# Patient Record
Sex: Male | Born: 2000
Health system: Southern US, Community
[De-identification: ages and names within clinical notes are randomized; demographics above are authoritative.]

## PROBLEM LIST (undated history)

## (undated) DIAGNOSIS — IMO0001 Reserved for inherently not codable concepts without codable children: Secondary | ICD-10-CM

## (undated) DIAGNOSIS — K219 Gastro-esophageal reflux disease without esophagitis: Secondary | ICD-10-CM

---
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
## 2020-06-14 DIAGNOSIS — K2900 Acute gastritis without bleeding: Secondary | ICD-10-CM | POA: Diagnosis not present

## 2020-06-14 DIAGNOSIS — Z0000 Encounter for general adult medical examination without abnormal findings: Secondary | ICD-10-CM | POA: Diagnosis not present

## 2020-06-14 DIAGNOSIS — K2100 Gastro-esophageal reflux disease with esophagitis, without bleeding: Secondary | ICD-10-CM | POA: Diagnosis not present

## 2020-06-14 DIAGNOSIS — R1013 Epigastric pain: Secondary | ICD-10-CM | POA: Diagnosis not present

## 2020-06-17 DIAGNOSIS — K2900 Acute gastritis without bleeding: Secondary | ICD-10-CM | POA: Diagnosis not present

## 2020-06-17 DIAGNOSIS — R1013 Epigastric pain: Secondary | ICD-10-CM | POA: Diagnosis not present

## 2020-06-17 DIAGNOSIS — Z0000 Encounter for general adult medical examination without abnormal findings: Secondary | ICD-10-CM | POA: Diagnosis not present

## 2020-06-17 DIAGNOSIS — K2100 Gastro-esophageal reflux disease with esophagitis, without bleeding: Secondary | ICD-10-CM | POA: Diagnosis not present

## 2020-10-18 ENCOUNTER — Ambulatory Visit (HOSPITAL_COMMUNITY)
Admission: EM | Admit: 2020-10-18 | Discharge: 2020-10-18 | Disposition: A | Discharge status: Home / Self Care | Payer: 59 | Attending: Emergency Medicine | Admitting: Emergency Medicine

## 2020-10-18 ENCOUNTER — Ambulatory Visit (INDEPENDENT_AMBULATORY_CARE_PROVIDER_SITE_OTHER): Payer: 59

## 2020-10-18 ENCOUNTER — Encounter (HOSPITAL_COMMUNITY): Payer: Self-pay | Admitting: Emergency Medicine

## 2020-10-18 ENCOUNTER — Other Ambulatory Visit: Payer: Self-pay

## 2020-10-18 DIAGNOSIS — S63502A Unspecified sprain of left wrist, initial encounter: Secondary | ICD-10-CM

## 2020-10-18 DIAGNOSIS — M79645 Pain in left finger(s): Secondary | ICD-10-CM | POA: Diagnosis not present

## 2020-10-18 DIAGNOSIS — M25532 Pain in left wrist: Secondary | ICD-10-CM

## 2020-10-18 DIAGNOSIS — S6992XA Unspecified injury of left wrist, hand and finger(s), initial encounter: Secondary | ICD-10-CM | POA: Diagnosis not present

## 2020-10-18 NOTE — ED Provider Notes (Signed)
[facility]    CSN: [id number] Arrival date & time: 10/18/20  1051      History   Chief Complaint Chief Complaint  Patient presents with   Hand Injury    Left     HPI Roger Franklin is a 20 y.o. male.   Patient here for evaluation of left hand and wrist pain that started earlier today.  Reports using a drill when it fell and "wrenched" left hand and wrist.  Reports aching pain that is worse with movement.  Reports decreased range of motion in left wrist and hand due to pain.  Patient is able to wiggle fingers and does have sensation in fingertips.  Denies any fevers, chest pain, shortness of breath, N/V/D, numbness, tingling, weakness, abdominal pain, or headaches.    The history is provided by the patient.  Hand Injury  Past Medical History:  Diagnosis Date   Reflux     There are no problems to display for this patient.   History reviewed. No pertinent surgical history.     Home Medications    Prior to Admission medications   Medication Sig Start Date End Date Taking? Authorizing Provider  amoxicillin (AMOXIL) 500 MG capsule Take 1 capsule (500 mg total) by mouth 3 (three) times daily. 04/18/18   Wurst, [place], PA-C  lidocaine (XYLOCAINE) 2 % solution Use as directed 15 mLs in the mouth or throat as needed for mouth pain. 04/18/18   Wurst, [place], PA-C  omeprazole (PRILOSEC) 20 MG capsule Take 20 mg by mouth daily.    [provider]    Family History Family History  Problem Relation Age of Onset   Healthy Mother    Healthy Father     Social History Social History   Tobacco Use   Smoking status: Never   Smokeless tobacco: Never  Vaping Use   Vaping Use: Never used  Substance Use Topics   Alcohol use: No   Drug use: No     Allergies   Patient has no known allergies.   Review of Systems Review of Systems  Musculoskeletal:  Positive for arthralgias and joint swelling.  All other systems reviewed and are  negative.   Physical Exam Triage Vital Signs ED Triage Vitals  Enc Vitals Group     BP 10/18/20 1149 113/75     Pulse Rate 10/18/20 1149 83     Resp --      Temp 10/18/20 1149 98 F (36.7 C)     Temp Source 10/18/20 1149 Oral     SpO2 10/18/20 1149 98 %     Weight --      Height --      Head Circumference --      Peak Flow --      Pain Score 10/18/20 1146 8     Pain Loc --      Pain Edu? --      Excl. in GC? --    No data found.  Updated Vital Signs BP 113/75 (BP Location: Right Arm)   Pulse 83   Temp 98 F (36.7 C) (Oral)   SpO2 98%   Visual Acuity Right Eye Distance:   Left Eye Distance:   Bilateral Distance:    Right Eye Near:   Left Eye Near:    Bilateral Near:     Physical Exam Vitals and nursing note reviewed.  Constitutional:      General: He is not in acute distress.    Appearance:  Normal appearance. He is not ill-appearing, toxic-appearing or diaphoretic.  HENT:     Head: Normocephalic and atraumatic.  Eyes:     Conjunctiva/sclera: Conjunctivae normal.  Cardiovascular:     Rate and Rhythm: Normal rate.     Pulses: Normal pulses.  Pulmonary:     Effort: Pulmonary effort is normal.  Abdominal:     General: Abdomen is flat.  Musculoskeletal:     Left wrist: Swelling, tenderness and bony tenderness present. Decreased range of motion. Normal pulse.     Left hand: Swelling, tenderness (left thumb) and bony tenderness present. Normal pulse.     Cervical back: Normal range of motion.  Skin:    General: Skin is warm and dry.  Neurological:     General: No focal deficit present.     Mental Status: He is alert and oriented to person, place, and time.  Psychiatric:        Mood and Affect: Mood normal.     UC Treatments / Results  Labs (all labs ordered are listed, but only abnormal results are displayed) Labs Reviewed - No data to display  EKG   Radiology DG Wrist Complete Left  Result Date: 10/18/2020 CLINICAL DATA:  Pain with history of  injury EXAM: LEFT WRIST - COMPLETE 3+ VIEW; LEFT THUMB 2+V COMPARISON:  None. FINDINGS: Left wrist and thumb: There is no evidence of fracture or dislocation. There is no evidence of arthropathy or other focal bone abnormality. Soft tissues are unremarkable. IMPRESSION: No acute osseous abnormality. Electronically Signed   By: Allegra Lai M.D.   On: 10/18/2020 12:54   DG Finger Thumb Left  Result Date: 10/18/2020 CLINICAL DATA:  Pain with history of injury EXAM: LEFT WRIST - COMPLETE 3+ VIEW; LEFT THUMB 2+V COMPARISON:  None. FINDINGS: Left wrist and thumb: There is no evidence of fracture or dislocation. There is no evidence of arthropathy or other focal bone abnormality. Soft tissues are unremarkable. IMPRESSION: No acute osseous abnormality. Electronically Signed   By: Allegra Lai M.D.   On: 10/18/2020 12:54    Procedures Procedures (including critical care time)  Medications Ordered in UC Medications - No data to display  Initial Impression / Assessment and Plan / UC Course  I have reviewed the triage vital signs and the nursing notes.  Pertinent labs & imaging results that were available during my care of the patient were reviewed by me and considered in my medical decision making (see chart for details).    Assessment negative for red flags or concerns.  X-ray with no acute bony abnormalities.  Likely a left wrist sprain.  Wrist brace applied in office and may wear as needed for comfort.  Tylenol and/or ibuprofen as needed.  Recommend rest, ice, compression, and elevation.  Follow-up with orthopedics or sports medicine if symptoms do not improve in the next week Final Clinical Impressions(s) / UC Diagnoses   Final diagnoses:  Wrist sprain, left, initial encounter     Discharge Instructions      You can take Ibuprofen and/or Tylenol as needed for pain relief and fever reduction.   Wear the splint as needed for comfort.   Rest as much as possible Ice for 10-15 minutes  every 4-6 hours as needed for pain and swelling Compression- use an ace bandage or splint for comfort Elevate above your hip/heart when sitting and laying down  Follow up with sports medicine or orthopedics if symptoms do not improve in the next few days.  ED Prescriptions   None    PDMP not reviewed this encounter.   Ivette Loyal, NP 10/18/20 1321

## 2020-10-18 NOTE — ED Triage Notes (Signed)
Pt injured left hand using a drill at work today.

## 2020-10-18 NOTE — Discharge Instructions (Signed)
You can take Ibuprofen and/or Tylenol as needed for pain relief and fever reduction.   Wear the splint as needed for comfort.   Rest as much as possible Ice for 10-15 minutes every 4-6 hours as needed for pain and swelling Compression- use an ace bandage or splint for comfort Elevate above your hip/heart when sitting and laying down  Follow up with sports medicine or orthopedics if symptoms do not improve in the next few days.

## 2021-02-08 ENCOUNTER — Other Ambulatory Visit: Payer: Self-pay

## 2021-02-08 ENCOUNTER — Emergency Department (HOSPITAL_COMMUNITY)
Admission: EM | Admit: 2021-02-08 | Discharge: 2021-02-08 | Disposition: A | Discharge status: Home / Self Care | Payer: No Typology Code available for payment source | Attending: Emergency Medicine | Admitting: Emergency Medicine

## 2021-02-08 ENCOUNTER — Encounter (HOSPITAL_COMMUNITY): Payer: Self-pay | Admitting: Emergency Medicine

## 2021-02-08 DIAGNOSIS — W231XXA Caught, crushed, jammed, or pinched between stationary objects, initial encounter: Secondary | ICD-10-CM | POA: Diagnosis not present

## 2021-02-08 DIAGNOSIS — S0990XA Unspecified injury of head, initial encounter: Secondary | ICD-10-CM | POA: Diagnosis present

## 2021-02-08 DIAGNOSIS — Y990 Civilian activity done for income or pay: Secondary | ICD-10-CM | POA: Diagnosis not present

## 2021-02-08 DIAGNOSIS — S0181XA Laceration without foreign body of other part of head, initial encounter: Secondary | ICD-10-CM | POA: Diagnosis not present

## 2021-02-08 NOTE — ED Triage Notes (Signed)
Pt was using a power tool when jumped back and hit the right side of his head while on a latter. Reports pain 6 out of 10. Pt reports ongoing feeling lightheaded since event. Pt has laceration to right upper eyebrow. No LOC. NO fall off of latter.

## 2021-02-08 NOTE — ED Provider Notes (Signed)
[facility] EMERGENCY DEPARTMENT Provider Note   CSN: [id number] Arrival date & time:        History  Chief Complaint  Patient presents with   Head Laceration    Roger Franklin is a 21 y.o. male presenting to the ED with a laceration to the forehead.  The patient ports he was at work today using of power tools or drill, and lost control the drill and jumped up and struck him in the right forehead.  He has a 1 similar laceration above his right eyebrow.  He denies loss of consciousness.  He denies any significant headache.  This incident occurred about 5 hours ago, and he feels otherwise normal.  He reports his tetanus is up-to-date through work.  HPI     Home Medications Prior to Admission medications   Medication Sig Start Date End Date Taking? Authorizing Provider  amoxicillin (AMOXIL) 500 MG capsule Take 1 capsule (500 mg total) by mouth 3 (three) times daily. 04/18/18   Wurst, [place], PA-C  lidocaine (XYLOCAINE) 2 % solution Use as directed 15 mLs in the mouth or throat as needed for mouth pain. 04/18/18   Wurst, [place], PA-C  omeprazole (PRILOSEC) 20 MG capsule Take 20 mg by mouth daily.    [provider]      Allergies    Patient has no known allergies.    Review of Systems   Review of Systems  Physical Exam Updated Vital Signs BP 115/73 (BP Location: Right Arm)    Pulse 82    Temp 97.8 F (36.6 C) (Oral)    Resp 13    SpO2 98%  Physical Exam Constitutional:      General: He is not in acute distress. HENT:     Head: Normocephalic.      Comments: 1 cm horizontal laceration above the right eyebrow, along forehead crease Eyes:     Conjunctiva/sclera: Conjunctivae normal.     Pupils: Pupils are equal, round, and reactive to light.  Skin:    General: Skin is warm and dry.  Neurological:     Mental Status: He is alert.  Psychiatric:        Mood and Affect: Mood normal.        Behavior: Behavior normal.    ED Results / Procedures  / Treatments   Labs (all labs ordered are listed, but only abnormal results are displayed) Labs Reviewed - No data to display  EKG None  Radiology No results found.  Procedures .Marland KitchenLaceration Repair  Date/Time: 02/08/2021 9:47 PM Performed by: Terald Sleeper, MD Authorized by: Terald Sleeper, MD   Consent:    Consent obtained:  Verbal   Consent given by:  Patient and parent   Risks discussed:  Pain and poor cosmetic result   Alternatives discussed:  No treatment Universal protocol:    Procedure explained and questions answered to patient or proxy's satisfaction: yes     Immediately prior to procedure, a time out was called: yes     Patient identity confirmed:  Arm band Anesthesia:    Anesthesia method:  None Laceration details:    Location:  Scalp   Scalp location:  Frontal   Length (cm):  1   Depth (mm):  2 Pre-procedure details:    Preparation:  Patient was prepped and draped in usual sterile fashion Treatment:    Area cleansed with:  Saline   Amount of cleaning:  Standard Skin repair:    Repair method:  Tissue adhesive    Medications Ordered in ED Medications - No data to display  ED Course/ Medical Decision Making/ A&P                           Medical Decision Making  Patient's tetanus is up-to-date.  The wound was irrigated and then closed using Dermabond and Steri-Strips.  We did have a discussion about sutures versus Dermabond, I think that the wound is small enough that glue would be appropriate.  There is a slightly increased risk of scarring this way, the patient understands that and is agreeable.  I did offer stitches but he does not want this at this time.  I do not see sign of infection otherwise.  I think he is reasonably stable for discharge.  Overall I believe this was relatively low impact trauma to the head.  It has been 5 hours since the incident he has no headache, no neurological deficits.  No significant signs of head injury otherwise.   My suspicion for brain bleed is quite low, and I do not think that emergent CT scanning of the brain is necessary.        Final Clinical Impression(s) / ED Diagnoses Final diagnoses:  Laceration of forehead, initial encounter    Rx / DC Orders ED Discharge Orders     None         Terald Sleeper, MD 02/08/21 2148

## 2021-02-08 NOTE — ED Notes (Signed)
ED Provider at bedside. 

## 2021-02-08 NOTE — ED Notes (Signed)
Pt is ambulating with mother to the restroom. No complaints noted. Suture cart at bedside.

## 2021-02-08 NOTE — ED Notes (Signed)
Vitals are complete mother is at bedside.

## 2021-05-15 DIAGNOSIS — S82831A Other fracture of upper and lower end of right fibula, initial encounter for closed fracture: Secondary | ICD-10-CM | POA: Diagnosis not present

## 2021-05-17 ENCOUNTER — Ambulatory Visit
Admission: EM | Admit: 2021-05-17 | Discharge: 2021-05-17 | Disposition: A | Discharge status: Home / Self Care | Payer: 59 | Attending: Internal Medicine | Admitting: Internal Medicine

## 2021-05-17 DIAGNOSIS — J020 Streptococcal pharyngitis: Secondary | ICD-10-CM | POA: Diagnosis not present

## 2021-05-17 LAB — POCT RAPID STREP A (OFFICE): Rapid Strep A Screen: POSITIVE — AB

## 2021-05-17 MED ORDER — AMOXICILLIN 500 MG PO CAPS: 500.0000 mg | ORAL_CAPSULE | Freq: Two times a day (BID) | ORAL | 0 refills | Status: AC

## 2021-05-17 NOTE — Discharge Instructions (Signed)
You have strep throat which is being treated with antibiotic.  Please follow-up if symptoms persist or worsen. 

## 2021-05-17 NOTE — ED Triage Notes (Signed)
Pt c/o sore throat, nasal drainage,  ? ?Denies cough, ear aches, headache, nausea ? ?Onset yesterday  ?

## 2021-05-17 NOTE — ED Provider Notes (Signed)
?[name] ? ? ? ?CSN: [id number] ?Arrival date & time: 05/17/21  0806 ? ? ?  ? ?History   ?Chief Complaint ?Chief Complaint  ?Patient presents with  ? Sore Throat  ? ? ?HPI ?Roger Franklin is a 21 y.o. male.  ? ?Patient presents with sore throat and nasal congestion that started yesterday.  Denies any known fevers at home.  Denies any known sick contacts.  Denies cough, chest pain, shortness of breath, ear pain, nausea, vomiting, diarrhea, abdominal pain.  Patient has taken ibuprofen for symptoms with minimal improvement. ? ? ?Sore Throat ? ? ?Past Medical History:  ?Diagnosis Date  ? Reflux   ? ? ?There are no problems to display for this patient. ? ? ?History reviewed. No pertinent surgical history. ? ? ? ? ?Home Medications   ? ?Prior to Admission medications   ?Medication Sig Start Date End Date Taking? Authorizing Provider  ?amoxicillin (AMOXIL) 500 MG capsule Take 1 capsule (500 mg total) by mouth 2 (two) times daily for 10 days. 05/17/21 05/27/21 Yes Gustavus Bryant, FNP  ?lidocaine (XYLOCAINE) 2 % solution Use as directed 15 mLs in the mouth or throat as needed for mouth pain. 04/18/18   Wurst, [place], PA-C  ?omeprazole (PRILOSEC) 20 MG capsule Take 20 mg by mouth daily.    [provider]  ? ? ?Family History ?Family History  ?Problem Relation Age of Onset  ? Healthy Mother   ? Healthy Father   ? ? ?Social History ?Social History  ? ?Tobacco Use  ? Smoking status: Never  ? Smokeless tobacco: Never  ?Vaping Use  ? Vaping Use: Never used  ?Substance Use Topics  ? Alcohol use: No  ? Drug use: No  ? ? ? ?Allergies   ?Patient has no known allergies. ? ? ?Review of Systems ?Review of Systems ?Per HPI ? ?Physical Exam ?Triage Vital Signs ?ED Triage Vitals  ?Enc Vitals Group  ?   BP 05/17/21 0827 108/73  ?   Pulse Rate 05/17/21 0827 92  ?   Resp 05/17/21 0827 18  ?   Temp 05/17/21 0827 98.1 ?F (36.7 ?C)  ?   Temp Source 05/17/21 0827 Oral  ?   SpO2 05/17/21 0827 98 %  ?   Weight --   ?   Height  --   ?   Head Circumference --   ?   Peak Flow --   ?   Pain Score 05/17/21 0828 0  ?   Pain Loc --   ?   Pain Edu? --   ?   Excl. in GC? --   ? ?No data found. ? ?Updated Vital Signs ?BP 108/73 (BP Location: Left Arm)   Pulse 92   Temp 98.1 ?F (36.7 ?C) (Oral)   Resp 18   SpO2 98%  ? ?Visual Acuity ?Right Eye Distance:   ?Left Eye Distance:   ?Bilateral Distance:   ? ?Right Eye Near:   ?Left Eye Near:    ?Bilateral Near:    ? ?Physical Exam ?Constitutional:   ?   General: He is not in acute distress. ?   Appearance: Normal appearance. He is not toxic-appearing or diaphoretic.  ?HENT:  ?   Head: Normocephalic and atraumatic.  ?   Right Ear: Tympanic membrane and ear canal normal.  ?   Left Ear: Tympanic membrane and ear canal normal.  ?   Nose: Congestion present.  ?   Mouth/Throat:  ?  Mouth: Mucous membranes are moist.  ?   Pharynx: Posterior oropharyngeal erythema present. No oropharyngeal exudate.  ?   Tonsils: No tonsillar exudate or tonsillar abscesses. 1+ on the right. 1+ on the left.  ?Eyes:  ?   Extraocular Movements: Extraocular movements intact.  ?   Conjunctiva/sclera: Conjunctivae normal.  ?   Pupils: Pupils are equal, round, and reactive to light.  ?Cardiovascular:  ?   Rate and Rhythm: Normal rate and regular rhythm.  ?   Pulses: Normal pulses.  ?   Heart sounds: Normal heart sounds.  ?Pulmonary:  ?   Effort: Pulmonary effort is normal. No respiratory distress.  ?   Breath sounds: Normal breath sounds. No stridor. No wheezing, rhonchi or rales.  ?Abdominal:  ?   General: Abdomen is flat. Bowel sounds are normal.  ?   Palpations: Abdomen is soft.  ?Musculoskeletal:     ?   General: Normal range of motion.  ?   Cervical back: Normal range of motion.  ?Skin: ?   General: Skin is warm and dry.  ?Neurological:  ?   General: No focal deficit present.  ?   Mental Status: He is alert and oriented to person, place, and time. Mental status is at baseline.  ?Psychiatric:     ?   Mood and Affect: Mood  normal.     ?   Behavior: Behavior normal.  ? ? ? ?UC Treatments / Results  ?Labs ?(all labs ordered are listed, but only abnormal results are displayed) ?Labs Reviewed  ?POCT RAPID STREP A (OFFICE) - Abnormal; Notable for the following components:  ?    Result Value  ? Rapid Strep A Screen Positive (*)   ? All other components within normal limits  ? ? ?EKG ? ? ?Radiology ?No results found. ? ?Procedures ?Procedures (including critical care time) ? ?Medications Ordered in UC ?Medications - No data to display ? ?Initial Impression / Assessment and Plan / UC Course  ?I have reviewed the triage vital signs and the nursing notes. ? ?Pertinent labs & imaging results that were available during my care of the patient were reviewed by me and considered in my medical decision making (see chart for details). ? ?  ? ?Rapid strep test was positive.  Will treat with amoxicillin.  No signs of peritonsillar abscess on exam.  Discussed supportive care and symptom management with patient.  Discussed return precautions.  Patient verbalized understanding and was agreeable with plan. ?Final Clinical Impressions(s) / UC Diagnoses  ? ?Final diagnoses:  ?Strep pharyngitis  ? ? ? ?Discharge Instructions   ? ?  ?You have strep throat which is being treated with antibiotic.  Please follow-up if symptoms persist or worsen. ? ? ? ?ED Prescriptions   ? ? Medication Sig Dispense Auth. Provider  ? amoxicillin (AMOXIL) 500 MG capsule Take 1 capsule (500 mg total) by mouth 2 (two) times daily for 10 days. 20 capsule Ervin Knack E, [place]  ? ?  ? ?PDMP not reviewed this encounter. ?  ?Gustavus Bryant, [place] ?05/17/21 8295 ? ?

## 2021-06-05 ENCOUNTER — Ambulatory Visit
Admission: EM | Admit: 2021-06-05 | Discharge: 2021-06-05 | Disposition: A | Discharge status: Home / Self Care | Payer: 59 | Attending: Family Medicine | Admitting: Family Medicine

## 2021-06-05 DIAGNOSIS — L03115 Cellulitis of right lower limb: Secondary | ICD-10-CM | POA: Diagnosis not present

## 2021-06-05 MED ORDER — MUPIROCIN 2 % EX OINT: 1.0000 "application " | TOPICAL_OINTMENT | Freq: Two times a day (BID) | CUTANEOUS | 0 refills | Status: DC

## 2021-06-05 MED ORDER — SULFAMETHOXAZOLE-TRIMETHOPRIM 800-160 MG PO TABS: 1.0000 | ORAL_TABLET | Freq: Two times a day (BID) | ORAL | 0 refills | Status: AC

## 2021-06-05 MED ORDER — PREDNISONE 20 MG PO TABS: 40.0000 mg | ORAL_TABLET | Freq: Every day | ORAL | 0 refills | Status: AC

## 2021-06-05 NOTE — ED Notes (Signed)
Wound cleansed and wrapped with bacitracin, gauze, Kerlix and coban ?

## 2021-06-05 NOTE — Discharge Instructions (Addendum)
Take prednisone 20 mg-2 daily for 3 days. This is for inflammation/possible reaction to spider/insect ? ?Take sulfa antibiotic 1 tab 2 times daily for 7 days.  ? ?You can wash the area 2 times daily with warm soapy water. Put new mupirocin antibiotic ointment on after you pat it dry. ? ?Tylenol or ibuprofen as needed for pain ?

## 2021-06-05 NOTE — ED Provider Notes (Signed)
?[name] ? ? ? ?CSN: [id number] ?Arrival date & time: 06/05/21  1930 ? ? ?  ? ?History   ?Chief Complaint ?Chief Complaint  ?Patient presents with  ? lesion on leg  ? ? ?HPI ?Roger Franklin is a 21 y.o. male.  ? ?HPI ?Here for redness and itching and swelling on his right lower leg. Began yesterday evening, and enlarged today. There is some swelling of the ankle, causing it to be hard to walk.  ? ?No f/c.  ? ?Past Medical History:  ?Diagnosis Date  ? Reflux   ? ? ?There are no problems to display for this patient. ? ? ?History reviewed. No pertinent surgical history. ? ? ? ? ?Home Medications   ? ?Prior to Admission medications   ?Medication Sig Start Date End Date Taking? Authorizing Provider  ?mupirocin ointment (BACTROBAN) 2 % Apply 1 application. topically 2 (two) times daily. To affected area till better 06/05/21  Yes Dorthy Magnussen, Janace Aris, MD  ?predniSONE (DELTASONE) 20 MG tablet Take 2 tablets (40 mg total) by mouth daily with breakfast for 3 days. 06/05/21 06/08/21 Yes Kaitelyn Jamison, Janace Aris, MD  ?sulfamethoxazole-trimethoprim (BACTRIM DS) 800-160 MG tablet Take 1 tablet by mouth 2 (two) times daily for 7 days. 06/05/21 06/12/21 Yes Zenia Resides, MD  ?omeprazole (PRILOSEC) 20 MG capsule Take 20 mg by mouth daily.    [provider]  ? ? ?Family History ?Family History  ?Problem Relation Age of Onset  ? Healthy Mother   ? Healthy Father   ? ? ?Social History ?Social History  ? ?Tobacco Use  ? Smoking status: Never  ? Smokeless tobacco: Never  ?Vaping Use  ? Vaping Use: Never used  ?Substance Use Topics  ? Alcohol use: No  ? Drug use: No  ? ? ? ?Allergies   ?Patient has no known allergies. ? ? ?Review of Systems ?Review of Systems ? ? ?Physical Exam ?Triage Vital Signs ?ED Triage Vitals [06/05/21 1951]  ?Enc Vitals Group  ?   BP 105/65  ?   Pulse Rate 95  ?   Resp 17  ?   Temp 98.1 ?F (36.7 ?C)  ?   Temp Source Oral  ?   SpO2 98 %  ?   Weight   ?   Height   ?   Head Circumference   ?   Peak  Flow   ?   Pain Score 8  ?   Pain Loc   ?   Pain Edu?   ?   Excl. in [place]?   ? ?No data found. ? ?Updated Vital Signs ?BP 105/65 (BP Location: Left Arm)   Pulse 95   Temp 98.1 ?F (36.7 ?C) (Oral)   Resp 17   SpO2 98%  ? ?Visual Acuity ?Right Eye Distance:   ?Left Eye Distance:   ?Bilateral Distance:   ? ?Right Eye Near:   ?Left Eye Near:    ?Bilateral Near:    ? ?Physical Exam ?Vitals reviewed.  ?Constitutional:   ?   General: He is not in acute distress. ?   Appearance: He is not ill-appearing, toxic-appearing or diaphoretic.  ?Cardiovascular:  ?   Rate and Rhythm: Normal rate and regular rhythm.  ?Musculoskeletal:  ?   Comments: Trace edema in the right ankle.  ?Skin: ?   Comments: Has an erythematous mildly indurated area about 3 cm diameter on his right lower lateral leg near the ankle. Has a central white colored area about  3 mm diameter. No fluctuance.  ?Neurological:  ?   Mental Status: He is alert and oriented to person, place, and time.  ?Psychiatric:     ?   Behavior: Behavior normal.  ? ? ? ?UC Treatments / Results  ?Labs ?(all labs ordered are listed, but only abnormal results are displayed) ?Labs Reviewed - No data to display ? ?EKG ? ? ?Radiology ?No results found. ? ?Procedures ?Procedures (including critical care time) ? ?Medications Ordered in UC ?Medications - No data to display ? ?Initial Impression / Assessment and Plan / UC Course  ?I have reviewed the triage vital signs and the nursing notes. ? ?Pertinent labs & imaging results that were available during my care of the patient were reviewed by me and considered in my medical decision making (see chart for details). ? ?  ? ?Will cover for cellulitis and prednisone in case is spider envenomation ?Final Clinical Impressions(s) / UC Diagnoses  ? ?Final diagnoses:  ?Cellulitis of right lower extremity  ? ? ? ?Discharge Instructions   ? ?  ?Take prednisone 20 mg-2 daily for 3 days. This is for inflammation/possible reaction to spider/insect ? ?Take  sulfa antibiotic 1 tab 2 times daily for 7 days.  ? ?You can wash the area 2 times daily with warm soapy water. Put new mupirocin antibiotic ointment on after you pat it dry. ? ?Tylenol or ibuprofen as needed for pain ? ? ? ? ?ED Prescriptions   ? ? Medication Sig Dispense Auth. Provider  ? sulfamethoxazole-trimethoprim (BACTRIM DS) 800-160 MG tablet Take 1 tablet by mouth 2 (two) times daily for 7 days. 14 tablet Esau Fridman, Gwenlyn Perking, MD  ? predniSONE (DELTASONE) 20 MG tablet Take 2 tablets (40 mg total) by mouth daily with breakfast for 3 days. 6 tablet Barrett Henle, MD  ? mupirocin ointment (BACTROBAN) 2 % Apply 1 application. topically 2 (two) times daily. To affected area till better 22 g Windy Carina Gwenlyn Perking, MD  ? ?  ? ?PDMP not reviewed this encounter. ?  ?Barrett Henle, MD ?06/05/21 2023 ? ?

## 2021-06-05 NOTE — ED Triage Notes (Signed)
Onset last night a red lesion on RLE. Pt reports at the onset the area was pruritic and smaller. Today, the area has increased and now has a burning sensation. No ointments applied. Lesion could be from a spider. ?

## 2021-06-06 DIAGNOSIS — S8264XA Nondisplaced fracture of lateral malleolus of right fibula, initial encounter for closed fracture: Secondary | ICD-10-CM | POA: Diagnosis not present

## 2021-06-06 DIAGNOSIS — M25561 Pain in right knee: Secondary | ICD-10-CM | POA: Diagnosis not present

## 2022-01-26 ENCOUNTER — Ambulatory Visit
Admission: EM | Admit: 2022-01-26 | Discharge: 2022-01-26 | Disposition: A | Discharge status: Home / Self Care | Payer: Commercial Managed Care - PPO | Attending: Nurse Practitioner | Admitting: Nurse Practitioner

## 2022-01-26 DIAGNOSIS — R6889 Other general symptoms and signs: Secondary | ICD-10-CM

## 2022-01-26 DIAGNOSIS — J029 Acute pharyngitis, unspecified: Secondary | ICD-10-CM

## 2022-01-26 LAB — POCT RAPID STREP A (OFFICE): Rapid Strep A Screen: NEGATIVE

## 2022-01-26 MED ORDER — OSELTAMIVIR PHOSPHATE 75 MG PO CAPS: 75.0000 mg | ORAL_CAPSULE | Freq: Two times a day (BID) | ORAL | 0 refills | Status: DC

## 2022-01-26 NOTE — ED Triage Notes (Signed)
Pt presents to uc with co of sore throat and fatigue since this morning. Pt reports otc tylenol.

## 2022-01-26 NOTE — Discharge Instructions (Signed)
Tamiflu as prescribed Rest and fluids Over-the-counter cough medication as needed Follow-up with your PCP in 2 to 3 days for recheck Please go to the ER for any worsening symptoms

## 2022-01-26 NOTE — ED Provider Notes (Signed)
[facility]    CSN: [id number] Arrival date & time: 01/26/22  1634      History   Chief Complaint Chief Complaint  Patient presents with   Sore Throat    HPI NATURE Roger Franklin is a 22 y.o. male The patient is a 22 y.o. male who presents for evaluation of URI symptoms for 1 days. Patient reports associated symptoms of chills, subjective fevers, sore throat, cough, congestion. Denies N/V/D, documented fevers, shortness of breath. Patient does not have a hx of asthma or smoking. No known sick contacts and no recent travel. Pt is not vaccinated for COVID. Pt is not vaccinated for flu this season. Pt has taken nothing OTC for symptoms. Pt has no other concerns at this time.    Sore Throat    Past Medical History:  Diagnosis Date   Reflux     There are no problems to display for this patient.   History reviewed. No pertinent surgical history.     Home Medications    Prior to Admission medications   Medication Sig Start Date End Date Taking? Authorizing Provider  oseltamivir (TAMIFLU) 75 MG capsule Take 1 capsule (75 mg total) by mouth every 12 (twelve) hours. 01/26/22  Yes Melynda Ripple, NP  mupirocin ointment (BACTROBAN) 2 % Apply 1 application. topically 2 (two) times daily. To affected area till better 06/05/21   Barrett Henle, MD  omeprazole (PRILOSEC) 20 MG capsule Take 20 mg by mouth daily.    [provider]    Family History Family History  Problem Relation Age of Onset   Healthy Mother    Healthy Father     Social History Social History   Tobacco Use   Smoking status: Never   Smokeless tobacco: Never  Vaping Use   Vaping Use: Never used  Substance Use Topics   Alcohol use: No   Drug use: No     Allergies   Patient has no known allergies.   Review of Systems Review of Systems  Constitutional:  Positive for fever.  HENT:  Positive for congestion and sore throat.   Respiratory:  Positive for cough.      Physical  Exam Triage Vital Signs ED Triage Vitals [01/26/22 1746]  Enc Vitals Group     BP (!) 91/50     Pulse Rate (!) 109     Resp 19     Temp (!) 100.4 F (38 C)     Temp Source Oral     SpO2 98 %     Weight      Height      Head Circumference      Peak Flow      Pain Score 0     Pain Loc      Pain Edu?      Excl. in [place]?    No data found.  Updated Vital Signs BP (!) 91/50   Pulse (!) 109   Temp (!) 100.4 F (38 C) (Oral)   Resp 19   SpO2 98%   Visual Acuity Right Eye Distance:   Left Eye Distance:   Bilateral Distance:    Right Eye Near:   Left Eye Near:    Bilateral Near:     Physical Exam Vitals and nursing note reviewed.  Constitutional:      General: He is not in acute distress.    Appearance: Normal appearance. He is not ill-appearing or toxic-appearing.  HENT:     Head:  Normocephalic and atraumatic.     Right Ear: Tympanic membrane and ear canal normal.     Left Ear: Tympanic membrane and ear canal normal.     Nose: Congestion present.     Mouth/Throat:     Mouth: Mucous membranes are moist.     Pharynx: Posterior oropharyngeal erythema present.  Eyes:     Pupils: Pupils are equal, round, and reactive to light.  Cardiovascular:     Rate and Rhythm: Normal rate and regular rhythm.     Heart sounds: Normal heart sounds.  Pulmonary:     Effort: Pulmonary effort is normal.     Breath sounds: Normal breath sounds.  Musculoskeletal:     Cervical back: Normal range of motion and neck supple.  Lymphadenopathy:     Cervical: No cervical adenopathy.  Skin:    General: Skin is warm and dry.  Neurological:     General: No focal deficit present.     Mental Status: He is alert and oriented to person, place, and time.  Psychiatric:        Mood and Affect: Mood normal.        Behavior: Behavior normal.      UC Treatments / Results  Labs (all labs ordered are listed, but only abnormal results are displayed) Labs Reviewed  CULTURE, GROUP A STREP [facility])   POCT RAPID STREP A (OFFICE)    EKG   Radiology No results found.  Procedures Procedures (including critical care time)  Medications Ordered in UC Medications - No data to display  Initial Impression / Assessment and Plan / UC Course  I have reviewed the triage vital signs and the nursing notes.  Pertinent labs & imaging results that were available during my care of the patient were reviewed by me and considered in my medical decision making (see chart for details).  Clinical Course as of 01/26/22 1814  Fri Jan 26, 2022  1812 100/65  [JM]    Clinical Course User Index [JM] Melynda Ripple, NP    Reviewed exam and symptoms with patient Discussed flulike illness, will start Tamiflu.  Patient declined COVID testing Negative rapid strep, will culture and contact if positive OTC analgesics for fever as needed Rest and fluids Patient declined Rx cough medicine will use OTC PCP follow-up 2 to 3 days for recheck ER for any worsening symptoms Final Clinical Impressions(s) / UC Diagnoses   Final diagnoses:  Flu-like symptoms  Sore throat     Discharge Instructions      Tamiflu as prescribed Rest and fluids Over-the-counter cough medication as needed Follow-up with your PCP in 2 to 3 days for recheck Please go to the ER for any worsening symptoms   ED Prescriptions     Medication Sig Dispense Auth. Provider   oseltamivir (TAMIFLU) 75 MG capsule Take 1 capsule (75 mg total) by mouth every 12 (twelve) hours. 10 capsule Melynda Ripple, NP      PDMP not reviewed this encounter.   Melynda Ripple, NP 01/26/22 [phone]

## 2022-01-29 LAB — CULTURE, GROUP A STREP (THRC)

## 2022-02-02 ENCOUNTER — Other Ambulatory Visit (HOSPITAL_COMMUNITY): Payer: Self-pay

## 2022-02-02 ENCOUNTER — Other Ambulatory Visit (HOSPITAL_BASED_OUTPATIENT_CLINIC_OR_DEPARTMENT_OTHER): Payer: Self-pay

## 2022-02-02 MED ORDER — AZITHROMYCIN 250 MG PO TABS
ORAL_TABLET | ORAL | 0 refills | Status: DC
  Filled 2022-02-02: qty 6, 5d supply, fill #0

## 2022-02-22 ENCOUNTER — Other Ambulatory Visit: Payer: Self-pay

## 2022-02-22 ENCOUNTER — Ambulatory Visit (HOSPITAL_COMMUNITY)
Admission: EM | Admit: 2022-02-22 | Discharge: 2022-02-22 | Disposition: A | Discharge status: Home / Self Care | Payer: Commercial Managed Care - PPO | Attending: Emergency Medicine | Admitting: Emergency Medicine

## 2022-02-22 ENCOUNTER — Encounter (HOSPITAL_COMMUNITY): Payer: Self-pay | Admitting: Emergency Medicine

## 2022-02-22 DIAGNOSIS — H66002 Acute suppurative otitis media without spontaneous rupture of ear drum, left ear: Secondary | ICD-10-CM | POA: Diagnosis not present

## 2022-02-22 DIAGNOSIS — J039 Acute tonsillitis, unspecified: Secondary | ICD-10-CM | POA: Diagnosis not present

## 2022-02-22 DIAGNOSIS — U071 COVID-19: Secondary | ICD-10-CM | POA: Insufficient documentation

## 2022-02-22 LAB — POC INFLUENZA A AND B ANTIGEN (URGENT CARE ONLY)
Influenza A Ag: NEGATIVE
Influenza B Ag: NEGATIVE

## 2022-02-22 MED ORDER — IBUPROFEN 400 MG PO TABS: 400.0000 mg | ORAL_TABLET | Freq: Three times a day (TID) | ORAL | 0 refills | Status: DC | PRN

## 2022-02-22 MED ORDER — FEXOFENADINE HCL 180 MG PO TABS: 180.0000 mg | ORAL_TABLET | Freq: Every day | ORAL | 1 refills | Status: DC

## 2022-02-22 MED ORDER — IPRATROPIUM BROMIDE 0.06 % NA SOLN: 2.0000 | Freq: Three times a day (TID) | NASAL | 1 refills | Status: DC

## 2022-02-22 MED ORDER — CEFDINIR 300 MG PO CAPS: 300.0000 mg | ORAL_CAPSULE | Freq: Two times a day (BID) | ORAL | 0 refills | Status: AC

## 2022-02-22 NOTE — Discharge Instructions (Addendum)
Your rapid influenza antigen test today was negative.  No further influenza testing is indicated.  You received a COVID-19 PCR test today.  The result of your COVID-19 test will be posted to your MyChart once it is complete, typically this takes 6 to 12 hours.      If your COVID-19 PCR test is positive, you will be contacted by phone.  Please discuss with the callback nurse whether or not you would benefit from antiviral therapy treatment for COVID-19.     If your COVID-19 PCR test is negative, please consider retesting in the next 2 to 3 days, particularly if you are not feeling any better.  You are welcome to return here to urgent care to have it done or you can take a home COVID-19 test.       Based on my physical exam findings and the history you have provided today, I do recommend that you begin antibiotics at this time for empiric treatment of a presumed bacterial inner ear infection that may be related to a sinus infection as well.  Your left tonsil is also swollen which may reflect infection in your left inner ear.       Please read below to learn more about the medications, dosages and frequencies that I recommend to help alleviate your symptoms and to get you feeling better soon:   Omnicef (cefdinir):  1 capsule twice daily for 10 days, you can take it with or without food.  This antibiotic can cause upset stomach, this will resolve once antibiotics are complete.  You are welcome to use a probiotic, eat yogurt, take Imodium while taking this medication.  Please avoid other systemic medications such as Maalox, Pepto-Bismol or milk of magnesia as they can interfere with your body's ability to absorb the antibiotics.       Atrovent (ipratropium): This is an excellent nasal decongestant spray I have added to your recommended nasal steroid that will not cause rebound congestion, please instill 2 sprays into each nare with each use.  Because nasal steroids can take several days before they begin to  provide full benefit, I recommend that you use this spray in addition to the nasal steroid prescribed for you.  Please use it after you have used your nasal steroid and repeat up to 4 times daily as needed.  I have provided you with a prescription for this medication.      Allegra (fexofenadine): This is an excellent second-generation antihistamine that helps to reduce respiratory inflammatory response to environmental allergens.  This medication is not known to cause daytime sleepiness so it can be taken in the daytime.  If you find that it does make you sleepy, please feel free to take it at bedtime.  Advil, Motrin (ibuprofen): This is a good anti-inflammatory medication which addresses aches, pains and inflammation of the upper airways that causes sinus and nasal congestion as well as in the lower airways which makes your cough feel tight and sometimes burn.  I recommend that you take between 400 to 600 mg every 6-8 hours as needed.      Robitussin, Mucinex (guaifenesin): This is an expectorant.  This helps break up chest congestion and loosen up thick nasal drainage making phlegm and drainage more liquid and therefore easier to remove.  I recommend being 400 mg three times daily as needed.    Promethazine DM: Promethazine is both a nasal decongestant and an antinausea medication that makes most patients feel fairly sleepy.  The DM  is dextromethorphan, a cough suppressant found in many over-the-counter cough medications.  Please take 5 mL before bedtime to minimize your cough which will help you sleep better.  I have sent a prescription for this medication to your pharmacy. Please follow-up within the next 5-7 days either with your primary care provider or urgent care if your symptoms do not resolve.  If you do not have a primary care provider, we will assist you in finding one.        Thank you for visiting urgent care today.  We appreciate the opportunity to participate in your care.

## 2022-02-22 NOTE — ED Provider Notes (Signed)
Roger Franklin    CSN: [id number] Arrival date & time: 02/22/22  1735    HISTORY   Chief Complaint  Patient presents with   Sore Throat   HPI Roger Franklin is a pleasant, 22 y.o. male who presents to urgent care today. Onset 1/3 of symptoms.  Went to ucc on 1/5.  Patient had fever, sore throat, dizziness, runny nose.  Strep test done and negative.     Was prescribed tamiflu-but did not take this medicine.     Pcp was called and received z-pack.  Patient did finished this medicine.  Patient did seem to get better.     Symptoms returned earlier this week.  Cough, flushed, runny nose, mild sore throat.    The history is provided by the patient and a parent.     Past Medical History:  Diagnosis Date   Reflux    There are no problems to display for this patient.  History reviewed. No pertinent surgical history.  Home Medications    Prior to Admission medications   Medication Sig Start Date End Date Taking? Authorizing Provider  azithromycin (ZITHROMAX) 250 MG tablet take 2 tablets by mouth on the first day then 1 tablet daily for remaining 4 days Patient not taking: Reported on 02/22/2022 02/02/22     mupirocin ointment (BACTROBAN) 2 % Apply 1 application. topically 2 (two) times daily. To affected area till better Patient not taking: Reported on 02/22/2022 06/05/21   Barrett Henle, MD  omeprazole (PRILOSEC) 20 MG capsule Take 20 mg by mouth daily. Patient not taking: Reported on 02/22/2022    [provider]  oseltamivir (TAMIFLU) 75 MG capsule Take 1 capsule (75 mg total) by mouth every 12 (twelve) hours. Patient not taking: Reported on 02/22/2022 01/26/22   Melynda Ripple, NP    Family History Family History  Problem Relation Age of Onset   Healthy Mother    Healthy Father    Social History Social History   Tobacco Use   Smoking status: Never   Smokeless tobacco: Never  Vaping Use   Vaping Use: Never used  Substance Use Topics   Alcohol use: No    Drug use: No   Allergies   Patient has no known allergies.  Review of Systems Review of Systems Pertinent findings revealed after performing a 14 point review of systems has been noted in the history of present illness.  Physical Exam Triage Vital Signs ED Triage Vitals  Enc Vitals Group     BP 11/18/20 0827 (!) 147/82     Pulse Rate 11/18/20 0827 72     Resp 11/18/20 0827 18     Temp 11/18/20 0827 98.3 F (36.8 C)     Temp Source 11/18/20 0827 Oral     SpO2 11/18/20 0827 98 %     Weight --      Height --      Head Circumference --      Peak Flow --      Pain Score 11/18/20 0826 5     Pain Loc --      Pain Edu? --      Excl. in [place]? --   No data found.  Updated Vital Signs BP 116/76 (BP Location: Left Arm)   Pulse (!) 112   Temp 98.8 F (37.1 C) (Oral)   Resp 20   SpO2 98%   Physical Exam Vitals and nursing note reviewed.  Constitutional:      General:  He is awake. He is not in acute distress.    Appearance: Normal appearance. He is well-developed, well-groomed and normal weight. He is ill-appearing. He is not toxic-appearing.  HENT:     Head: Normocephalic and atraumatic.     Salivary Glands: Right salivary gland is not diffusely enlarged or tender. Left salivary gland is diffusely enlarged. Left salivary gland is not tender.     Right Ear: Hearing, tympanic membrane, ear canal and external ear normal. No drainage. No middle ear effusion. There is impacted cerumen. Tympanic membrane is not erythematous or bulging.     Left Ear: Hearing, ear canal and external ear normal. No drainage. A middle ear effusion is present. There is no impacted cerumen. Tympanic membrane is injected, erythematous and bulging.     Nose: Mucosal edema, congestion and rhinorrhea present. No nasal deformity or septal deviation. Rhinorrhea is clear.     Right Turbinates: Not enlarged, swollen or pale.     Left Turbinates: Not enlarged, swollen or pale.     Right Sinus: No maxillary sinus  tenderness or frontal sinus tenderness.     Left Sinus: No maxillary sinus tenderness or frontal sinus tenderness.     Mouth/Throat:     Lips: Pink. No lesions.     Mouth: Mucous membranes are moist. No oral lesions.     Tongue: No lesions. Tongue does not deviate from midline.     Palate: No mass and lesions.     Pharynx: Oropharynx is clear. Uvula midline. No pharyngeal swelling, oropharyngeal exudate, posterior oropharyngeal erythema or uvula swelling.     Tonsils: No tonsillar exudate. 0 on the right. 3+ on the left.  Eyes:     General: Lids are normal. Vision grossly intact. No allergic shiner.       Right eye: No discharge.        Left eye: No discharge.     Extraocular Movements: Extraocular movements intact.     Conjunctiva/sclera: Conjunctivae normal.     Right eye: Right conjunctiva is not injected.     Left eye: Left conjunctiva is not injected.     Pupils: Pupils are equal, round, and reactive to light.  Neck:     Trachea: Trachea and phonation normal.  Cardiovascular:     Rate and Rhythm: Normal rate and regular rhythm.     Pulses: Normal pulses.     Heart sounds: Normal heart sounds. No murmur heard.    No friction rub. No gallop.  Pulmonary:     Effort: Pulmonary effort is normal. No tachypnea, bradypnea, accessory muscle usage, prolonged expiration, respiratory distress or retractions.     Breath sounds: Normal breath sounds. No stridor, decreased air movement or transmitted upper airway sounds. No decreased breath sounds, wheezing, rhonchi or rales.  Chest:     Chest wall: No tenderness.  Musculoskeletal:        General: Normal range of motion.     Cervical back: Full passive range of motion without pain, normal range of motion and neck supple. Normal range of motion.  Lymphadenopathy:     Cervical: Cervical adenopathy present.     Right cervical: Superficial cervical adenopathy, deep cervical adenopathy and posterior cervical adenopathy present.     Left  cervical: Superficial cervical adenopathy, deep cervical adenopathy and posterior cervical adenopathy present.  Skin:    General: Skin is warm and dry.     Findings: No erythema or rash.  Neurological:     General: No focal deficit present.  Mental Status: He is alert and oriented to person, place, and time.  Psychiatric:        Mood and Affect: Mood normal.        Behavior: Behavior normal. Behavior is cooperative.     Visual Acuity Right Eye Distance:   Left Eye Distance:   Bilateral Distance:    Right Eye Near:   Left Eye Near:    Bilateral Near:     UC Couse / Diagnostics / Procedures:     Radiology No results found.  Procedures Procedures (including critical care time) EKG  Pending results:  Labs Reviewed  SARS CORONAVIRUS 2 (TAT 6-24 HRS)  POC INFLUENZA A AND B ANTIGEN (URGENT CARE ONLY)    Medications Ordered in UC: Medications - No data to display  UC Diagnoses / Final Clinical Impressions(s)   I have reviewed the triage vital signs and the nursing notes.  Pertinent labs & imaging results that were available during my care of the patient were reviewed by me and considered in my medical decision making (see chart for details).    Final diagnoses:  Acute suppurative otitis media of left ear  Acute tonsillitis, unspecified etiology   Rapid flu test was negative.  Patient states he had mono when he was younger.  COVID-19 test pending, due to uncertain duration of symptoms Paxlovid not indicated, patient was encouraged to consider COVID-19 vaccination given that his mother works in the [employer].  Patient provided with [facility] for empiric treatment for presumed bacterial left inner ear infection and likely sinus infection that is what his upper respiratory symptoms at this time.  Allergy medications provided to reduce upper respiratory inflammation and decrease his risk of requiring further respiratory infections for the foreseeable future.  Return  precautions advised.  Please see discharge instructions below for further details of plan of care as provided to patient. ED Prescriptions     Medication Sig Dispense Auth. Provider   cefdinir (OMNICEF) 300 MG capsule Take 1 capsule (300 mg total) by mouth 2 (two) times daily for 10 days. 20 capsule Theadora Rama Scales, PA-C   ipratropium (ATROVENT) 0.06 % nasal spray Place 2 sprays into both nostrils 3 (three) times daily. As needed for nasal congestion, runny nose 15 mL Theadora Rama Scales, PA-C   fexofenadine (ALLEGRA) 180 MG tablet Take 1 tablet (180 mg total) by mouth daily. 90 tablet Theadora Rama Scales, PA-C   ibuprofen (ADVIL) 400 MG tablet Take 1 tablet (400 mg total) by mouth every 8 (eight) hours as needed for up to 30 doses. 30 tablet Theadora Rama Scales, PA-C      PDMP not reviewed this encounter.  Disposition Upon Discharge:  Condition: stable for discharge home Home: take medications as prescribed; routine discharge instructions as discussed; follow up as advised.  Patient presented with an acute illness with associated systemic symptoms and significant discomfort requiring urgent management. In my opinion, this is a condition that a prudent lay person (someone who possesses an average knowledge of health and medicine) may potentially expect to result in complications if not addressed urgently such as respiratory distress, impairment of bodily function or dysfunction of bodily organs.   Routine symptom specific, illness specific and/or disease specific instructions were discussed with the patient and/or caregiver at length.   As such, the patient has been evaluated and assessed, work-up was performed and treatment was provided in alignment with urgent care protocols and evidence based medicine.  Patient/parent/caregiver has been advised that the patient may  require follow up for further testing and treatment if the symptoms continue in spite of treatment, as  clinically indicated and appropriate.  If the patient was tested for COVID-19, Influenza and/or RSV, then the patient/parent/guardian was advised to isolate at home pending the results of his/her diagnostic coronavirus test and potentially longer if they're positive. I have also advised pt that if his/her COVID-19 test returns positive, it's recommended to self-isolate for at least 10 days after symptoms first appeared AND until fever-free for 24 hours without fever reducer AND other symptoms have improved or resolved. Discussed self-isolation recommendations as well as instructions for household member/close contacts as per the [facility] and [place] DHHS, and also gave patient the [place] packet with this information.  Patient/parent/caregiver has been advised to return to the [facility] or PCP in 3-5 days if no better; to PCP or the Emergency Department if new signs and symptoms develop, or if the current signs or symptoms continue to change or worsen for further workup, evaluation and treatment as clinically indicated and appropriate  The patient will follow up with their current PCP if and as advised. If the patient does not currently have a PCP we will assist them in obtaining one.   The patient may need specialty follow up if the symptoms continue, in spite of conservative treatment and management, for further workup, evaluation, consultation and treatment as clinically indicated and appropriate.  Patient/parent/caregiver verbalized understanding and agreement of plan as discussed.  All questions were addressed during visit.  Please see discharge instructions below for further details of plan.  Discharge Instructions:   Discharge Instructions      Your rapid influenza antigen test today was negative.  No further influenza testing is indicated.  You received a COVID-19 PCR test today.  The result of your COVID-19 test will be posted to your MyChart once it is complete, typically this takes 6 to 12 hours.       If your COVID-19 PCR test is positive, you will be contacted by phone.  Please discuss with the callback nurse whether or not you would benefit from antiviral therapy treatment for COVID-19.     If your COVID-19 PCR test is negative, please consider retesting in the next 2 to 3 days, particularly if you are not feeling any better.  You are welcome to return here to urgent care to have it done or you can take a home COVID-19 test.       Based on my physical exam findings and the history you have provided today, I do recommend that you begin antibiotics at this time for empiric treatment of a presumed bacterial inner ear infection that may be related to a sinus infection as well.  Your left tonsil is also swollen which may reflect infection in your left inner ear.       Please read below to learn more about the medications, dosages and frequencies that I recommend to help alleviate your symptoms and to get you feeling better soon:   Omnicef (cefdinir):  1 capsule twice daily for 10 days, you can take it with or without food.  This antibiotic can cause upset stomach, this will resolve once antibiotics are complete.  You are welcome to use a probiotic, eat yogurt, take Imodium while taking this medication.  Please avoid other systemic medications such as Maalox, Pepto-Bismol or milk of magnesia as they can interfere with your body's ability to absorb the antibiotics.       Atrovent (ipratropium): This is  an excellent nasal decongestant spray I have added to your recommended nasal steroid that will not cause rebound congestion, please instill 2 sprays into each nare with each use.  Because nasal steroids can take several days before they begin to provide full benefit, I recommend that you use this spray in addition to the nasal steroid prescribed for you.  Please use it after you have used your nasal steroid and repeat up to 4 times daily as needed.  I have provided you with a prescription for this  medication.      Allegra (fexofenadine): This is an excellent second-generation antihistamine that helps to reduce respiratory inflammatory response to environmental allergens.  This medication is not known to cause daytime sleepiness so it can be taken in the daytime.  If you find that it does make you sleepy, please feel free to take it at bedtime.  Advil, Motrin (ibuprofen): This is a good anti-inflammatory medication which addresses aches, pains and inflammation of the upper airways that causes sinus and nasal congestion as well as in the lower airways which makes your cough feel tight and sometimes burn.  I recommend that you take between 400 to 600 mg every 6-8 hours as needed.      Robitussin, Mucinex (guaifenesin): This is an expectorant.  This helps break up chest congestion and loosen up thick nasal drainage making phlegm and drainage more liquid and therefore easier to remove.  I recommend being 400 mg three times daily as needed.    Promethazine DM: Promethazine is both a nasal decongestant and an antinausea medication that makes most patients feel fairly sleepy.  The DM is dextromethorphan, a cough suppressant found in many over-the-counter cough medications.  Please take 5 mL before bedtime to minimize your cough which will help you sleep better.  I have sent a prescription for this medication to your pharmacy. Please follow-up within the next 5-7 days either with your primary care provider or urgent care if your symptoms do not resolve.  If you do not have a primary care provider, we will assist you in finding one.        Thank you for visiting urgent care today.  We appreciate the opportunity to participate in your care.       This office note has been dictated using Teaching laboratory technician.  Unfortunately, this method of dictation can sometimes lead to typographical or grammatical errors.  I apologize for your inconvenience in advance if this occurs.  Please do not  hesitate to reach out to me if clarification is needed.      Theadora Rama Scales, [place] 02/22/22 [phone]

## 2022-02-22 NOTE — ED Triage Notes (Signed)
Onset 1/3 of symptoms.  Went to ucc on 1/5.  Patient had fever, sore throat, dizziness, runny nose.  Strep test done and negative.    Was prescribed tamiflu-but did not take this medicine.    Pcp was called and received z-pack.  Patient did finished this medicine.  Patient did seem to get better.    Symptoms returned earlier this week.  Cough, flushed, runny nose, mild sore throat.

## 2022-02-23 LAB — SARS CORONAVIRUS 2 (TAT 6-24 HRS): SARS Coronavirus 2: POSITIVE — AB

## 2022-04-12 ENCOUNTER — Ambulatory Visit
Admission: EM | Admit: 2022-04-12 | Discharge: 2022-04-12 | Disposition: A | Discharge status: Home / Self Care | Payer: Commercial Managed Care - PPO | Attending: Internal Medicine | Admitting: Internal Medicine

## 2022-04-12 DIAGNOSIS — J069 Acute upper respiratory infection, unspecified: Secondary | ICD-10-CM | POA: Diagnosis not present

## 2022-04-12 DIAGNOSIS — H6121 Impacted cerumen, right ear: Secondary | ICD-10-CM | POA: Diagnosis not present

## 2022-04-12 DIAGNOSIS — R509 Fever, unspecified: Secondary | ICD-10-CM | POA: Insufficient documentation

## 2022-04-12 LAB — POCT RAPID STREP A (OFFICE): Rapid Strep A Screen: NEGATIVE

## 2022-04-12 LAB — POCT INFLUENZA A/B
Influenza A, POC: NEGATIVE
Influenza B, POC: NEGATIVE

## 2022-04-12 MED ORDER — ACETAMINOPHEN 325 MG PO TABS
975.0000 mg | ORAL_TABLET | Freq: Once | ORAL | Status: AC
  Administered 2022-04-12: 975 mg via ORAL

## 2022-04-12 MED ORDER — IBUPROFEN 800 MG PO TABS: 800.0000 mg | ORAL_TABLET | Freq: Once | ORAL | Status: DC

## 2022-04-12 MED ORDER — BENZONATATE 100 MG PO CAPS: 100.0000 mg | ORAL_CAPSULE | Freq: Three times a day (TID) | ORAL | 0 refills | Status: DC

## 2022-04-12 NOTE — Discharge Instructions (Addendum)
You have a viral upper respiratory infection.  Strep testing was negative. Throat culture pending.  Flu testing negative.  We cleaned out your ear today, purchase debrox ear drops over the counter and use these as directed to the right ear to help with wax buildup.   Use the following medicines to help with symptoms: - Plain Mucinex (guaifenesin) over the counter as directed every 12 hours to thin mucous so that you are able to get it out of your body easier. Drink plenty of water while taking this medication so that it works well in your body (at least 8 cups a day).  - Tylenol 1,000mg  and/or ibuprofen 600mg  every 6 hours with food as needed for aches/pains or fever/chills.  - Tessalon perles every 8 hours as needed for cough.  1 tablespoon of honey in warm water and/or salt water gargles may also help with symptoms. Humidifier to your room will help add water to the air and reduce coughing.  If you develop any new or worsening symptoms, please return.  If your symptoms are severe, please go to the emergency room.  Follow-up with your primary care provider for further evaluation and management of your symptoms as well as ongoing wellness visits.  I hope you feel better!

## 2022-04-12 NOTE — ED Triage Notes (Signed)
Triage completed by provider.

## 2022-04-12 NOTE — ED Provider Notes (Signed)
[name]    CSN: [id number] Arrival date & time: 04/12/22  1701      History   Chief Complaint Chief Complaint  Patient presents with   Sore Throat   Fever    HPI Roger Franklin is a 22 y.o. male.   Patient presents to urgent care for evaluation of sore throat, chills, dizziness, mild cough, and slight nasal congestion that started yesterday.  Also complains of right ear pain without drainage, tinnitus, or decreased hearing.  He states that he has been very cold ever since symptoms started and has not checked his temperature at home but believes he may have had a fever.  No recent known sick contacts with similar symptoms.  Cough is dry and intermittent.  Sore throat is significant and worse with swallowing.  History of exercise-induced asthma, has not needed to use his albuterol inhaler in "a long time".  Requesting refill of this today though in case he should find himself with need to use it.  Non-smoker, denies drug use.  No recent antibiotics or steroids.  Denies nausea, vomiting, abdominal pain, rash, vision changes, headache, neck pain, and generalized bodyaches.  Has been using ibuprofen 400 mg intermittently.  Last dose of ibuprofen was 6 hours ago. Fever currently 102.      Past Medical History:  Diagnosis Date   Reflux     There are no problems to display for this patient.   No past surgical history on file.     Home Medications    Prior to Admission medications   Medication Sig Start Date End Date Taking? Authorizing Provider  benzonatate (TESSALON) 100 MG capsule Take 1 capsule (100 mg total) by mouth every 8 (eight) hours. 04/12/22  Yes Talbot Grumbling, FNP  fexofenadine (ALLEGRA) 180 MG tablet Take 1 tablet (180 mg total) by mouth daily. 02/22/22 08/21/22  Lynden Oxford Scales, PA-C  ibuprofen (ADVIL) 400 MG tablet Take 1 tablet (400 mg total) by mouth every 8 (eight) hours as needed for up to 30 doses. 02/22/22   Lynden Oxford Scales,  PA-C  ipratropium (ATROVENT) 0.06 % nasal spray Place 2 sprays into both nostrils 3 (three) times daily. As needed for nasal congestion, runny nose 02/22/22   Lynden Oxford Scales, PA-C    Family History Family History  Problem Relation Age of Onset   Healthy Mother    Healthy Father     Social History Social History   Tobacco Use   Smoking status: Never   Smokeless tobacco: Never  Vaping Use   Vaping Use: Never used  Substance Use Topics   Alcohol use: No   Drug use: No     Allergies   Patient has no known allergies.   Review of Systems Review of Systems Per HPI  Physical Exam Triage Vital Signs ED Triage Vitals [04/12/22 1814]  Enc Vitals Group     BP 112/72     Pulse Rate (!) 107     Resp 17     Temp (!) 102 F (38.9 C)     Temp Source Oral     SpO2 96 %     Weight      Height      Head Circumference      Peak Flow      Pain Score      Pain Loc      Pain Edu?      Excl. in [place]?    No data found.  Updated  Vital Signs BP 112/72 (BP Location: Left Arm)   Pulse (!) 107   Temp (!) 102 F (38.9 C) (Oral)   Resp 17   SpO2 96%   Visual Acuity Right Eye Distance:   Left Eye Distance:   Bilateral Distance:    Right Eye Near:   Left Eye Near:    Bilateral Near:     Physical Exam Vitals and nursing note reviewed.  Constitutional:      Appearance: He is ill-appearing. He is not toxic-appearing.  HENT:     Head: Normocephalic and atraumatic.     Right Ear: Hearing, ear canal and external ear normal. There is impacted cerumen.     Left Ear: Hearing, tympanic membrane, ear canal and external ear normal.     Nose: Congestion present.     Mouth/Throat:     Lips: Pink.     Mouth: Mucous membranes are moist.     Pharynx: Posterior oropharyngeal erythema present.  Eyes:     General: Lids are normal. Vision grossly intact. Gaze aligned appropriately.        Right eye: No discharge.        Left eye: No discharge.     Extraocular Movements:  Extraocular movements intact.     Conjunctiva/sclera: Conjunctivae normal.     Pupils: Pupils are equal, round, and reactive to light.  Cardiovascular:     Rate and Rhythm: Normal rate and regular rhythm.     Heart sounds: Normal heart sounds, S1 normal and S2 normal.  Pulmonary:     Effort: Pulmonary effort is normal. No respiratory distress.     Breath sounds: Normal breath sounds and air entry. No wheezing, rhonchi or rales.  Musculoskeletal:     Cervical back: Neck supple.  Lymphadenopathy:     Cervical: Cervical adenopathy present.  Skin:    General: Skin is warm and dry.     Capillary Refill: Capillary refill takes less than 2 seconds.     Findings: No rash.  Neurological:     General: No focal deficit present.     Mental Status: He is alert and oriented to person, place, and time. Mental status is at baseline.     Cranial Nerves: No dysarthria or facial asymmetry.  Psychiatric:        Mood and Affect: Mood normal.        Speech: Speech normal.        Behavior: Behavior normal.        Thought Content: Thought content normal.        Judgment: Judgment normal.      UC Treatments / Results  Labs (all labs ordered are listed, but only abnormal results are displayed) Labs Reviewed  CULTURE, GROUP A STREP [facility])  POCT RAPID STREP A (OFFICE)  POCT INFLUENZA A/B    EKG   Radiology No results found.  Procedures Procedures (including critical care time)  Medications Ordered in UC Medications  acetaminophen (TYLENOL) tablet 975 mg (975 mg Oral Given 04/12/22 1915)    Initial Impression / Assessment and Plan / UC Course  I have reviewed the triage vital signs and the nursing notes.  Pertinent labs & imaging results that were available during my care of the patient were reviewed by me and considered in my medical decision making (see chart for details).   1. Viral URI with cough, fever Symptoms and physical exam consistent with a viral upper respiratory tract  infection that will likely resolve with rest, fluids,  and prescriptions for symptomatic relief. Deferred imaging based on stable cardiopulmonary exam and hemodynamically stable vital signs. Patient has had COVID-19 in the last 90 days, therefore deferred COVID-19 testing. Influenza and strep testing in clinic negative, throat culture pending. Will manage as viral syndrome until throat culture returns and treat with antibiotics if indicated.   Patient given tylenol 975mg  in clinic today for fever.   Tessalon perles sent to pharmacy for symptomatic relief to be taken as prescribed.  May continue taking over the counter medications as directed for further symptomatic relief. Nonpharmacologic interventions for symptom relief provided and after visit summary below. Advised to push fluids to stay well hydrated while recovering from viral illness.   2. Impacted cerumen Right ear cleaned with ear lavage in clinic to remove ear wax impaction by nursing staff. Reassessment shows some residual wax. I was able to use a curette to scoop some of the remaining wax out of the right ear without pain. Patient may use debrox ear drops at home as needed for wax removal and has been advised to avoid using Q-tips.   Discussed physical exam and available lab work findings in clinic with patient.  Counseled patient regarding appropriate use of medications and potential side effects for all medications recommended or prescribed today. Discussed red flag signs and symptoms of worsening condition,when to call the PCP office, return to urgent care, and when to seek higher level of care in the emergency department. Patient verbalizes understanding and agreement with plan. All questions answered. Patient discharged in stable condition.    Final Clinical Impressions(s) / UC Diagnoses   Final diagnoses:  Viral URI with cough  Fever, unspecified  Impacted cerumen of right ear     Discharge Instructions      You have a viral  upper respiratory infection.  Strep testing was negative. Throat culture pending.  Flu testing negative.  We cleaned out your ear today, purchase debrox ear drops over the counter and use these as directed to the right ear to help with wax buildup.   Use the following medicines to help with symptoms: - Plain Mucinex (guaifenesin) over the counter as directed every 12 hours to thin mucous so that you are able to get it out of your body easier. Drink plenty of water while taking this medication so that it works well in your body (at least 8 cups a day).  - Tylenol 1,000mg  and/or ibuprofen 600mg  every 6 hours with food as needed for aches/pains or fever/chills.  - Tessalon perles every 8 hours as needed for cough.  1 tablespoon of honey in warm water and/or salt water gargles may also help with symptoms. Humidifier to your room will help add water to the air and reduce coughing.  If you develop any new or worsening symptoms, please return.  If your symptoms are severe, please go to the emergency room.  Follow-up with your primary care provider for further evaluation and management of your symptoms as well as ongoing wellness visits.  I hope you feel better!     ED Prescriptions     Medication Sig Dispense Auth. Provider   benzonatate (TESSALON) 100 MG capsule Take 1 capsule (100 mg total) by mouth every 8 (eight) hours. 21 capsule Talbot Grumbling, FNP      PDMP not reviewed this encounter.   Talbot Grumbling, [place] 04/14/22 1930

## 2022-04-15 LAB — CULTURE, GROUP A STREP (THRC)

## 2022-06-19 ENCOUNTER — Other Ambulatory Visit: Payer: Self-pay

## 2022-06-19 ENCOUNTER — Ambulatory Visit (HOSPITAL_COMMUNITY)
Admission: EM | Admit: 2022-06-19 | Discharge: 2022-06-19 | Disposition: A | Discharge status: Home / Self Care | Payer: Commercial Managed Care - PPO | Attending: Family Medicine | Admitting: Family Medicine

## 2022-06-19 ENCOUNTER — Encounter (HOSPITAL_COMMUNITY): Payer: Self-pay | Admitting: Emergency Medicine

## 2022-06-19 DIAGNOSIS — T25221A Burn of second degree of right foot, initial encounter: Secondary | ICD-10-CM | POA: Diagnosis not present

## 2022-06-19 DIAGNOSIS — L03115 Cellulitis of right lower limb: Secondary | ICD-10-CM

## 2022-06-19 MED ORDER — AMOXICILLIN-POT CLAVULANATE 875-125 MG PO TABS: 1.0000 | ORAL_TABLET | Freq: Two times a day (BID) | ORAL | 0 refills | Status: AC

## 2022-06-19 MED ORDER — SILVER SULFADIAZINE 1 % EX CREA: 1.0000 | TOPICAL_CREAM | Freq: Every day | CUTANEOUS | 0 refills | Status: AC

## 2022-06-19 NOTE — ED Provider Notes (Signed)
[facility]    CSN: [id number] Arrival date & time: 06/19/22  1937      History   Chief Complaint Chief Complaint  Patient presents with   Foot Pain    HPI PEDRITO SHELLHORN is a 22 y.o. male.    Foot Pain  Here for redness and blisters on his right foot and ankle.  A few days ago he had had some bug bites that were itching on his right foot and ankle.  And neighbor gave him a "home remedy" cream and he applied it on there on May 26; then yesterday, on May 27 he had some redness.  The redness is everywhere that the neighbor apply the cream.  Then today he is noted blisters, about 3, and the largest 1 is on his anterior ankle.  No fever or chills.  No itching  The red area is very irritated and sore  Past Medical History:  Diagnosis Date   Reflux     There are no problems to display for this patient.   History reviewed. No pertinent surgical history.     Home Medications    Prior to Admission medications   Medication Sig Start Date End Date Taking? Authorizing Provider  amoxicillin-clavulanate (AUGMENTIN) 875-125 MG tablet Take 1 tablet by mouth 2 (two) times daily for 7 days. 06/19/22 06/26/22 Yes Fradel Baldonado, Janace Aris, MD  silver sulfADIAZINE (SILVADENE) 1 % cream Apply 1 Application topically daily. 06/19/22  Yes Volney Reierson, Janace Aris, MD    Family History Family History  Problem Relation Age of Onset   Healthy Mother    Healthy Father     Social History Social History   Tobacco Use   Smoking status: Never   Smokeless tobacco: Never  Vaping Use   Vaping Use: Former  Substance Use Topics   Alcohol use: No   Drug use: No     Allergies   Patient has no known allergies.   Review of Systems Review of Systems   Physical Exam Triage Vital Signs ED Triage Vitals  Enc Vitals Group     BP 06/19/22 1958 116/69     Pulse Rate 06/19/22 1958 84     Resp 06/19/22 1958 18     Temp 06/19/22 1958 98.3 F (36.8 C)     Temp Source 06/19/22 1958  Oral     SpO2 06/19/22 1958 98 %     Weight --      Height --      Head Circumference --      Peak Flow --      Pain Score 06/19/22 1956 5     Pain Loc --      Pain Edu? --      Excl. in [place]? --    No data found.  Updated Vital Signs BP 116/69 (BP Location: Right Arm)   Pulse 84   Temp 98.3 F (36.8 C) (Oral)   Resp 18   SpO2 98%   Visual Acuity Right Eye Distance:   Left Eye Distance:   Bilateral Distance:    Right Eye Near:   Left Eye Near:    Bilateral Near:     Physical Exam Vitals reviewed.  Constitutional:      General: He is not in acute distress.    Appearance: He is not ill-appearing, toxic-appearing or diaphoretic.  Skin:    Coloration: Skin is not jaundiced or pale.     Comments: There is some erythema that is confluent over  his anterior right ankle and proximal foot and extends onto his medial ankle.  It is slightly indurated and tender.  There is no sign cutaneous fluctuance.  There is a blister that is about 2 x 1 cm on the anterior ankle and then there are 2 more that are about a centimeter and a half in diameter.   Distally capillary refill is intact.  Neurological:     Mental Status: He is alert and oriented to person, place, and time.  Psychiatric:        Behavior: Behavior normal.      UC Treatments / Results  Labs (all labs ordered are listed, but only abnormal results are displayed) Labs Reviewed - No data to display  EKG   Radiology No results found.  Procedures Procedures (including critical care time)  Medications Ordered in UC Medications - No data to display  Initial Impression / Assessment and Plan / UC Course  I have reviewed the triage vital signs and the nursing notes.  Pertinent labs & imaging results that were available during my care of the patient were reviewed by me and considered in my medical decision making (see chart for details).    He states his last tetanus was about 3 years ago.  Augmentin is sent in  for possible cellulitis, and Silvadene is sent and to use as burn care.  He is given contact information for the wound care center.  Staff will wrap his burn tonight.  He is given off 3 days to allow the wound to start healing and allow him to put his foot up more.  Final Clinical Impressions(s) / UC Diagnoses   Final diagnoses:  Partial thickness burn of right foot, initial encounter  Cellulitis of right lower extremity     Discharge Instructions      Take amoxicillin-clavulanate 875 mg--1 tab twice daily with food for 7 days  Twice a day at first and then once a day as it is healing, then wash your wound with soapy water.  Then apply new Silvadene cream.     ED Prescriptions     Medication Sig Dispense Auth. Provider   amoxicillin-clavulanate (AUGMENTIN) 875-125 MG tablet Take 1 tablet by mouth 2 (two) times daily for 7 days. 14 tablet Nasia Cannan, Janace Aris, MD   silver sulfADIAZINE (SILVADENE) 1 % cream Apply 1 Application topically daily. 50 g Zenia Resides, MD      PDMP not reviewed this encounter.   Zenia Resides, MD 06/19/22 2026

## 2022-06-19 NOTE — ED Triage Notes (Addendum)
Reports bug bites/mosquitos.  Reports specifically slapping at mosquitos on this ankle/foot a few days ago.  Sunday night used an ointment on right ankle/foot.  A neighbors ointment, not sure of name or content of ointment.  Blisters started today.   Large blisters to right ankle, redness to ankle.   Reports using benadryl

## 2022-06-19 NOTE — Discharge Instructions (Signed)
Take amoxicillin-clavulanate 875 mg--1 tab twice daily with food for 7 days  Twice a day at first and then once a day as it is healing, then wash your wound with soapy water.  Then apply new Silvadene cream.

## 2022-06-21 DIAGNOSIS — L139 Bullous disorder, unspecified: Secondary | ICD-10-CM | POA: Diagnosis not present

## 2022-06-21 DIAGNOSIS — Z681 Body mass index (BMI) 19 or less, adult: Secondary | ICD-10-CM | POA: Diagnosis not present

## 2022-06-25 ENCOUNTER — Encounter: Payer: Self-pay | Admitting: Dermatology

## 2022-06-25 ENCOUNTER — Telehealth: Payer: Self-pay

## 2022-06-25 ENCOUNTER — Telehealth: Payer: Self-pay | Admitting: Dermatology

## 2022-06-25 ENCOUNTER — Ambulatory Visit (INDEPENDENT_AMBULATORY_CARE_PROVIDER_SITE_OTHER): Payer: Commercial Managed Care - PPO | Admitting: Dermatology

## 2022-06-25 VITALS — BP 105/70

## 2022-06-25 DIAGNOSIS — L0100 Impetigo, unspecified: Secondary | ICD-10-CM

## 2022-06-25 DIAGNOSIS — L241 Irritant contact dermatitis due to oils and greases: Secondary | ICD-10-CM | POA: Diagnosis not present

## 2022-06-25 DIAGNOSIS — L0103 Bullous impetigo: Secondary | ICD-10-CM | POA: Diagnosis not present

## 2022-06-25 MED ORDER — MUPIROCIN 2 % EX OINT: 1.0000 | TOPICAL_OINTMENT | Freq: Two times a day (BID) | CUTANEOUS | 0 refills | Status: AC

## 2022-06-25 NOTE — Telephone Encounter (Signed)
Roger Franklin,  Was this the question he had about a work note? Or did he call again with a medical question?

## 2022-06-25 NOTE — Telephone Encounter (Signed)
Patient called in requesting a call back , he has question about his follow up care and what he is suppose to do

## 2022-06-25 NOTE — Progress Notes (Unsigned)
   New Patient Visit   Subjective  Roger Franklin is a 22 y.o. male who presents for the following: One week ago he was bitten by mosquitoes or some type insect, that night he used a home remedy cream from a neighbor. He then broke out with redness and burning rash. He then went to work using a boot and he broke out with blisters.  It is a 6 on pain scale. He has been out from work every day last week since Tuesday. He saw his PCP last Thursday and was prescribed Clobetasol 0.05% which helps with itch. He is also taking Amoxicillin 500mg  twice daily.   The following portions of the chart were reviewed this encounter and updated as appropriate: medications, allergies, medical history  Review of Systems:  No other skin or systemic complaints except as noted in HPI or Assessment and Plan.  Objective  Well appearing patient in no apparent distress; mood and affect are within normal limits.  A focused examination was performed of the following areas: Right ankle  Relevant exam findings are noted in the Assessment and Plan.    Assessment & Plan   Irritant Contact Dermatitis Exam: erythematous patches and bullae  Treatment Plan: Continue Clobetasol ointment 2 x daily. Mix with Mupirocin ointment when applying, cover with a non-stick pad. Advised wrapping the area with Curlex or an Ace bandage.  Bullous Impetigo Exam: vesicles  Treatment Plan: Continue Amoxicillin as prescribed. Vesicles drained using a sterile 18G needle. Mupirocin and a non-stick guaze pad was applied. Ankle wrapped with Curlex.   Return if symptoms worsen or fail to improve, for allergic contact derm.  Jaclynn Guarneri, CMA, am acting as scribe for [employer], DO.   Documentation: I have reviewed the above documentation for accuracy and completeness, and I agree with the above.  Langston Reusing, DO

## 2022-06-25 NOTE — Telephone Encounter (Signed)
I spoke to him after his visit. He forgot to get a note for work from the front desk and asked for how long he may need to be out from work. As per Dr. Onalee Hua he should be able to wear work boots in 5 days. A letter was emailed for him

## 2022-06-25 NOTE — Patient Instructions (Addendum)
Treatment Plan: Continue Clobetasol ointment 2 x daily. Mix with Mupirocin ointment when applying    Due to recent changes in healthcare laws, you may see results of your pathology and/or laboratory studies on MyChart before the doctors have had a chance to review them. We understand that in some cases there may be results that are confusing or concerning to you. Please understand that not all results are received at the same time and often the doctors may need to interpret multiple results in order to provide you with the best plan of care or course of treatment. Therefore, we ask that you please give [place] 2 business days to thoroughly review all your results before contacting the office for clarification. Should we see a critical lab result, you will be contacted sooner.   If You Need Anything After Your Visit  If you have any questions or concerns for your doctor, please call our main line at [phone] If no one answers, please leave a voicemail as directed and we will return your call as soon as possible. Messages left after 4 pm will be answered the following business day.   You may also send [place] a message via MyChart. We typically respond to MyChart messages within 1-2 business days.  For prescription refills, please ask your pharmacy to contact our office. Our fax number is [phone].  If you have an urgent issue when the clinic is closed that cannot wait until the next business day, you can page your doctor at the number below.    Please note that while we do our best to be available for urgent issues outside of office hours, we are not available 24/7.   If you have an urgent issue and are unable to reach [place], you may choose to seek medical care at your doctor's office, retail clinic, urgent care center, or emergency room.  If you have a medical emergency, please immediately call 911 or go to the emergency department. In the event of inclement weather, please call our main line at  [phone] for an update on the status of any delays or closures.  Dermatology Medication Tips: Please keep the boxes that topical medications come in in order to help keep track of the instructions about where and how to use these. Pharmacies typically print the medication instructions only on the boxes and not directly on the medication tubes.   If your medication is too expensive, please contact our office at [phone] or send [place] a message through MyChart.   We are unable to tell what your co-pay for medications will be in advance as this is different depending on your insurance coverage. However, we may be able to find a substitute medication at lower cost or fill out paperwork to get insurance to cover a needed medication.   If a prior authorization is required to get your medication covered by your insurance company, please allow [place] 1-2 business days to complete this process.  Drug prices often vary depending on where the prescription is filled and some pharmacies may offer cheaper prices.  The website www.goodrx.com contains coupons for medications through different pharmacies. The prices here do not account for what the cost may be with help from insurance (it may be cheaper with your insurance), but the website can give you the price if you did not use any insurance.  - You can print the associated coupon and take it with your prescription to the pharmacy.  - You may also stop by our  office during regular business hours and pick up a GoodRx coupon card.  - If you need your prescription sent electronically to a different pharmacy, notify our office through [facility] or by phone at [phone]

## 2022-09-28 ENCOUNTER — Other Ambulatory Visit (HOSPITAL_COMMUNITY): Payer: Self-pay

## 2022-09-28 MED ORDER — HYDROCODONE-ACETAMINOPHEN 10-325 MG PO TABS
1.0000 | ORAL_TABLET | Freq: Four times a day (QID) | ORAL | 0 refills | Status: AC | PRN
  Filled 2022-09-28: qty 12, 3d supply, fill #0

## 2022-09-28 MED ORDER — AMOXICILLIN 500 MG PO CAPS
500.0000 mg | ORAL_CAPSULE | Freq: Three times a day (TID) | ORAL | 0 refills | Status: AC
  Filled 2022-09-28: qty 15, 5d supply, fill #0

## 2023-03-11 ENCOUNTER — Ambulatory Visit
Admission: EM | Admit: 2023-03-11 | Discharge: 2023-03-11 | Disposition: A | Discharge status: Home / Self Care | Payer: Commercial Managed Care - PPO | Attending: Family Medicine | Admitting: Family Medicine

## 2023-03-11 DIAGNOSIS — J069 Acute upper respiratory infection, unspecified: Secondary | ICD-10-CM | POA: Diagnosis not present

## 2023-03-11 DIAGNOSIS — J111 Influenza due to unidentified influenza virus with other respiratory manifestations: Secondary | ICD-10-CM

## 2023-03-11 LAB — POC COVID19/FLU A&B COMBO
Covid Antigen, POC: NEGATIVE
Influenza A Antigen, POC: NEGATIVE
Influenza B Antigen, POC: NEGATIVE

## 2023-03-11 MED ORDER — FLUTICASONE PROPIONATE 50 MCG/ACT NA SUSP: 1.0000 | Freq: Two times a day (BID) | NASAL | 2 refills | Status: AC

## 2023-03-11 MED ORDER — OSELTAMIVIR PHOSPHATE 75 MG PO CAPS: 75.0000 mg | ORAL_CAPSULE | Freq: Two times a day (BID) | ORAL | 0 refills | Status: AC

## 2023-03-11 MED ORDER — PROMETHAZINE-DM 6.25-15 MG/5ML PO SYRP: 5.0000 mL | ORAL_SOLUTION | Freq: Four times a day (QID) | ORAL | 0 refills | Status: AC | PRN

## 2023-03-11 NOTE — ED Triage Notes (Signed)
Cough, possible fever pt states he felt hot but did not check it at home, body aches, chills, runny nose, pain in chest when cough x 2 days. Taking Theraflu.

## 2023-03-11 NOTE — ED Provider Notes (Signed)
[facility]    CSN: [id number] Arrival date & time: 03/11/23  1615      History   Chief Complaint Chief Complaint  Patient presents with   Cough   Generalized Body Aches    HPI Roger Franklin is a 23 y.o. male.   Presenting today with 2-day history of fever, chills, body aches, nasal congestion, productive cough, fatigue.  Denies chest pain, shortness of breath, abdominal pain, vomiting, diarrhea.  So far trying TheraFlu, ibuprofen and Tylenol with mild temporary benefit.  Multiple sick contacts recently.    Past Medical History:  Diagnosis Date   Reflux     There are no active problems to display for this patient.   History reviewed. No pertinent surgical history.     Home Medications    Prior to Admission medications   Medication Sig Start Date End Date Taking? Authorizing Provider  fluticasone (FLONASE) 50 MCG/ACT nasal spray Place 1 spray into both nostrils 2 (two) times daily. 03/11/23  Yes Particia Nearing, PA-C  oseltamivir (TAMIFLU) 75 MG capsule Take 1 capsule (75 mg total) by mouth every 12 (twelve) hours. 03/11/23  Yes Particia Nearing, PA-C  promethazine-dextromethorphan (PROMETHAZINE-DM) 6.25-15 MG/5ML syrup Take 5 mLs by mouth 4 (four) times daily as needed. 03/11/23  Yes Particia Nearing, PA-C  amoxicillin (AMOXIL) 500 MG capsule 1 capsule Orally twice a day    [provider]  amoxicillin (AMOXIL) 500 MG capsule Take 1 capsule (500 mg total) by mouth 3 times daily until finished 09/28/22     clobetasol cream (TEMOVATE) 0.05 % 1 application Externally Twice a day for 10 days 06/21/22   [provider]  HYDROcodone-acetaminophen (NORCO) 10-325 MG tablet Take 1 tablet by mouth every 6 (six) hours as needed for pain 09/28/22     Multiple Vitamins-Minerals (MULTI FOR HIM) TABS as directed Orally    [provider]  mupirocin ointment (BACTROBAN) 2 % Apply 1 Application topically 2 (two) times daily. Apply to  affected area at right ankle and cover with non-stick bandage 06/25/22   Terri Piedra, DO  silver sulfADIAZINE (SILVADENE) 1 % cream Apply 1 Application topically daily. Patient not taking: Reported on 06/25/2022 06/19/22   Zenia Resides, MD    Family History Family History  Problem Relation Age of Onset   Healthy Mother    Healthy Father     Social History Social History   Tobacco Use   Smoking status: Never   Smokeless tobacco: Never  Vaping Use   Vaping status: Former  Substance Use Topics   Alcohol use: No   Drug use: No     Allergies   Patient has no known allergies.   Review of Systems Review of Systems PER HPI  Physical Exam Triage Vital Signs ED Triage Vitals [03/11/23 1747]  Encounter Vitals Group     BP 111/68     Systolic BP Percentile      Diastolic BP Percentile      Pulse Rate 79     Resp 16     Temp 98.2 F (36.8 C)     Temp Source Oral     SpO2 98 %     Weight      Height      Head Circumference      Peak Flow      Pain Score 0     Pain Loc      Pain Education      Exclude  from Growth Chart    No data found.  Updated Vital Signs BP 111/68 (BP Location: Right Arm)   Pulse 79   Temp 98.2 F (36.8 C) (Oral)   Resp 16   SpO2 98%   Visual Acuity Right Eye Distance:   Left Eye Distance:   Bilateral Distance:    Right Eye Near:   Left Eye Near:    Bilateral Near:     Physical Exam Vitals and nursing note reviewed.  Constitutional:      Appearance: He is well-developed.  HENT:     Head: Atraumatic.     Right Ear: External ear normal.     Left Ear: External ear normal.     Nose: Rhinorrhea present.     Mouth/Throat:     Pharynx: Posterior oropharyngeal erythema present. No oropharyngeal exudate.  Eyes:     Conjunctiva/sclera: Conjunctivae normal.     Pupils: Pupils are equal, round, and reactive to light.  Cardiovascular:     Rate and Rhythm: Normal rate and regular rhythm.  Pulmonary:     Effort: Pulmonary effort  is normal. No respiratory distress.     Breath sounds: No wheezing or rales.  Musculoskeletal:        General: Normal range of motion.     Cervical back: Normal range of motion and neck supple.  Lymphadenopathy:     Cervical: No cervical adenopathy.  Skin:    General: Skin is warm and dry.  Neurological:     Mental Status: He is alert and oriented to person, place, and time.  Psychiatric:        Behavior: Behavior normal.      UC Treatments / Results  Labs (all labs ordered are listed, but only abnormal results are displayed) Labs Reviewed  POC COVID19/FLU A&B COMBO    EKG   Radiology No results found.  Procedures Procedures (including critical care time)  Medications Ordered in UC Medications - No data to display  Initial Impression / Assessment and Plan / UC Course  I have reviewed the triage vital signs and the nursing notes.  Pertinent labs & imaging results that were available during my care of the patient were reviewed by me and considered in my medical decision making (see chart for details).     Vitals and exam reassuring today, rapid flu and COVID-negative but consistent with flulike illness.  Treat with Tamiflu, Phenergan DM, Flonase, supportive over-the-counter medications and home care.  Return for worsening symptoms.  Final Clinical Impressions(s) / UC Diagnoses   Final diagnoses:  Viral URI with cough  Influenza-like illness   Discharge Instructions   None    ED Prescriptions     Medication Sig Dispense Auth. Provider   oseltamivir (TAMIFLU) 75 MG capsule Take 1 capsule (75 mg total) by mouth every 12 (twelve) hours. 10 capsule Particia Nearing, [place]   promethazine-dextromethorphan (PROMETHAZINE-DM) 6.25-15 MG/5ML syrup Take 5 mLs by mouth 4 (four) times daily as needed. 100 mL Particia Nearing, PA-C   fluticasone [facility]) 50 MCG/ACT nasal spray Place 1 spray into both nostrils 2 (two) times daily. 16 g Particia Nearing,  [place]      PDMP not reviewed this encounter.   Particia Nearing, [place] 03/11/23 [phone]

## 2023-05-12 IMAGING — DX DG WRIST COMPLETE 3+V*L*
4 series · 4 of 4 positions shown · non-contrast
Comparison: None.

CLINICAL DATA: Pain with history of injury

EXAM:
LEFT WRIST - COMPLETE 3+ VIEW; LEFT THUMB 2+V

[wrist pa]
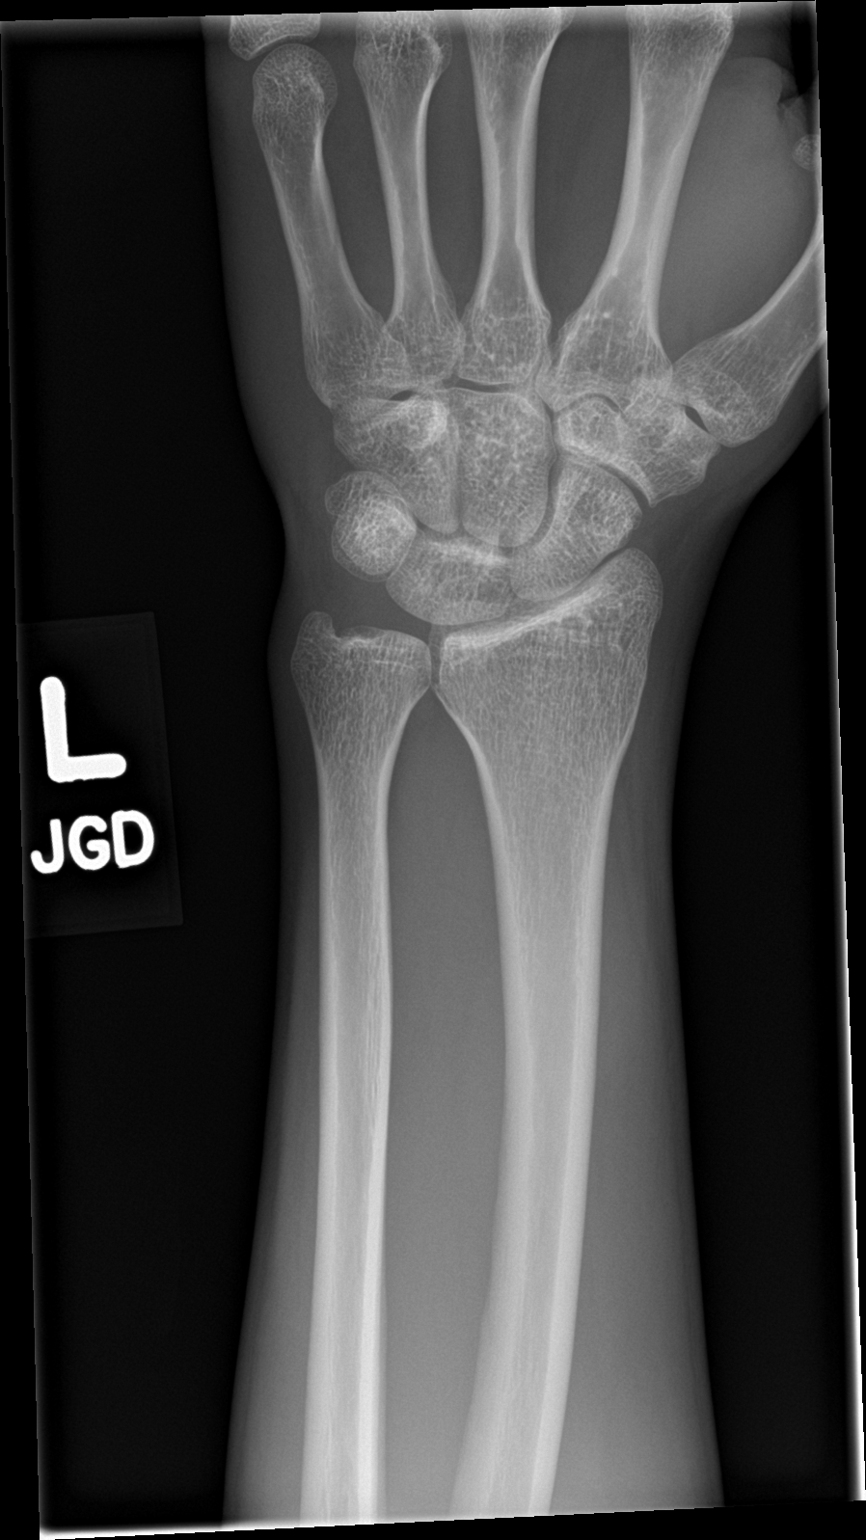

[wrist navicular]
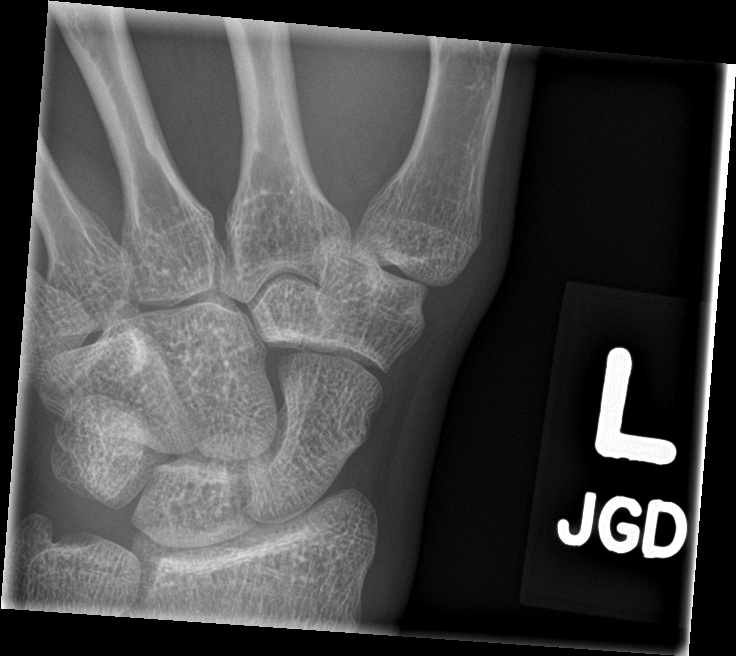

[wrist obl]
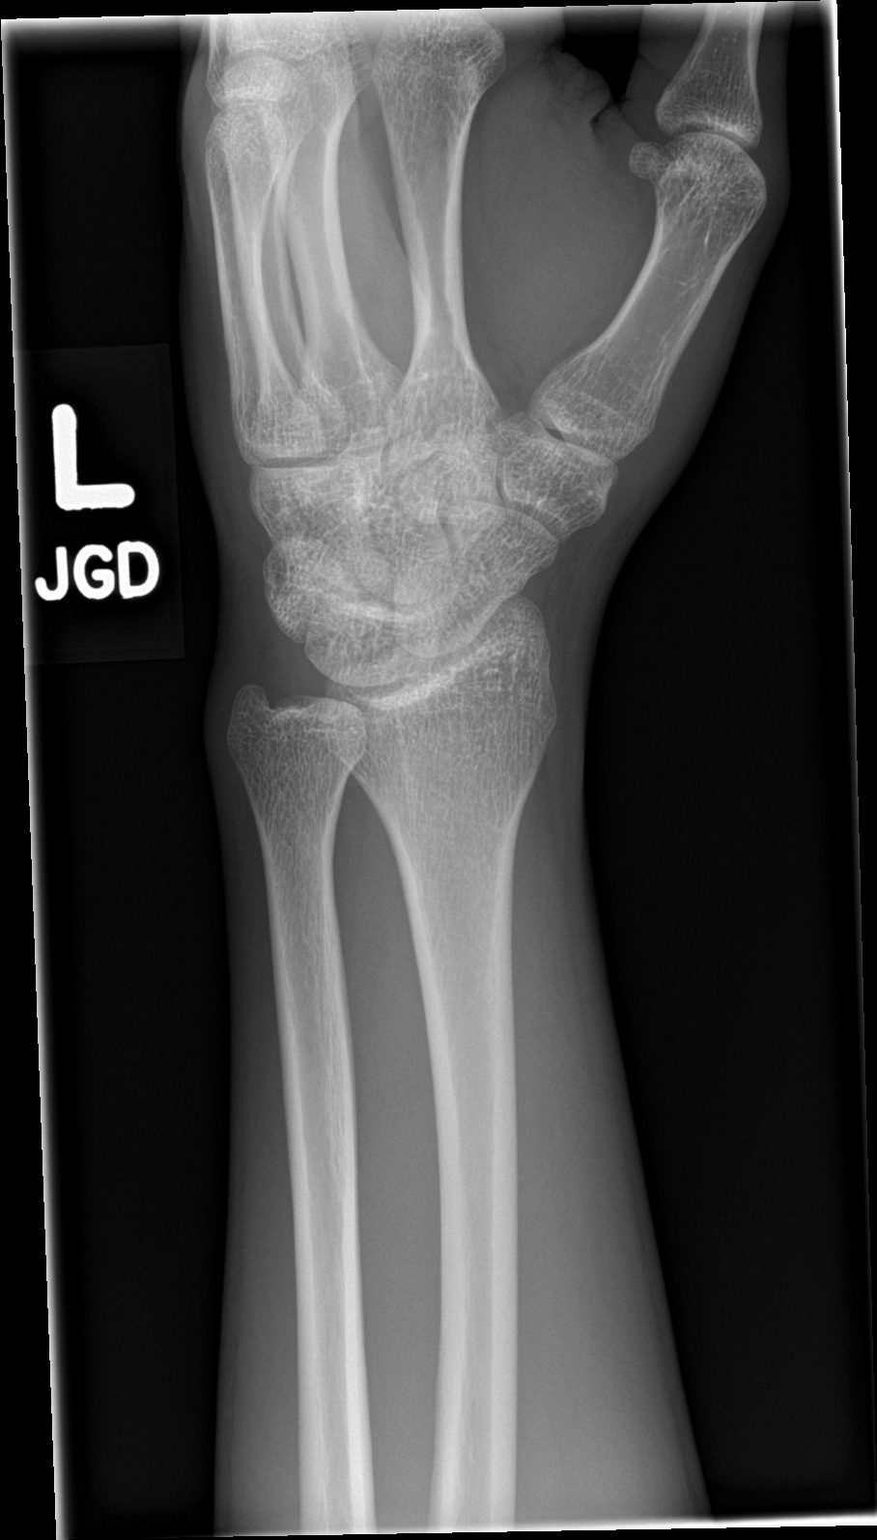

[wrist lat]
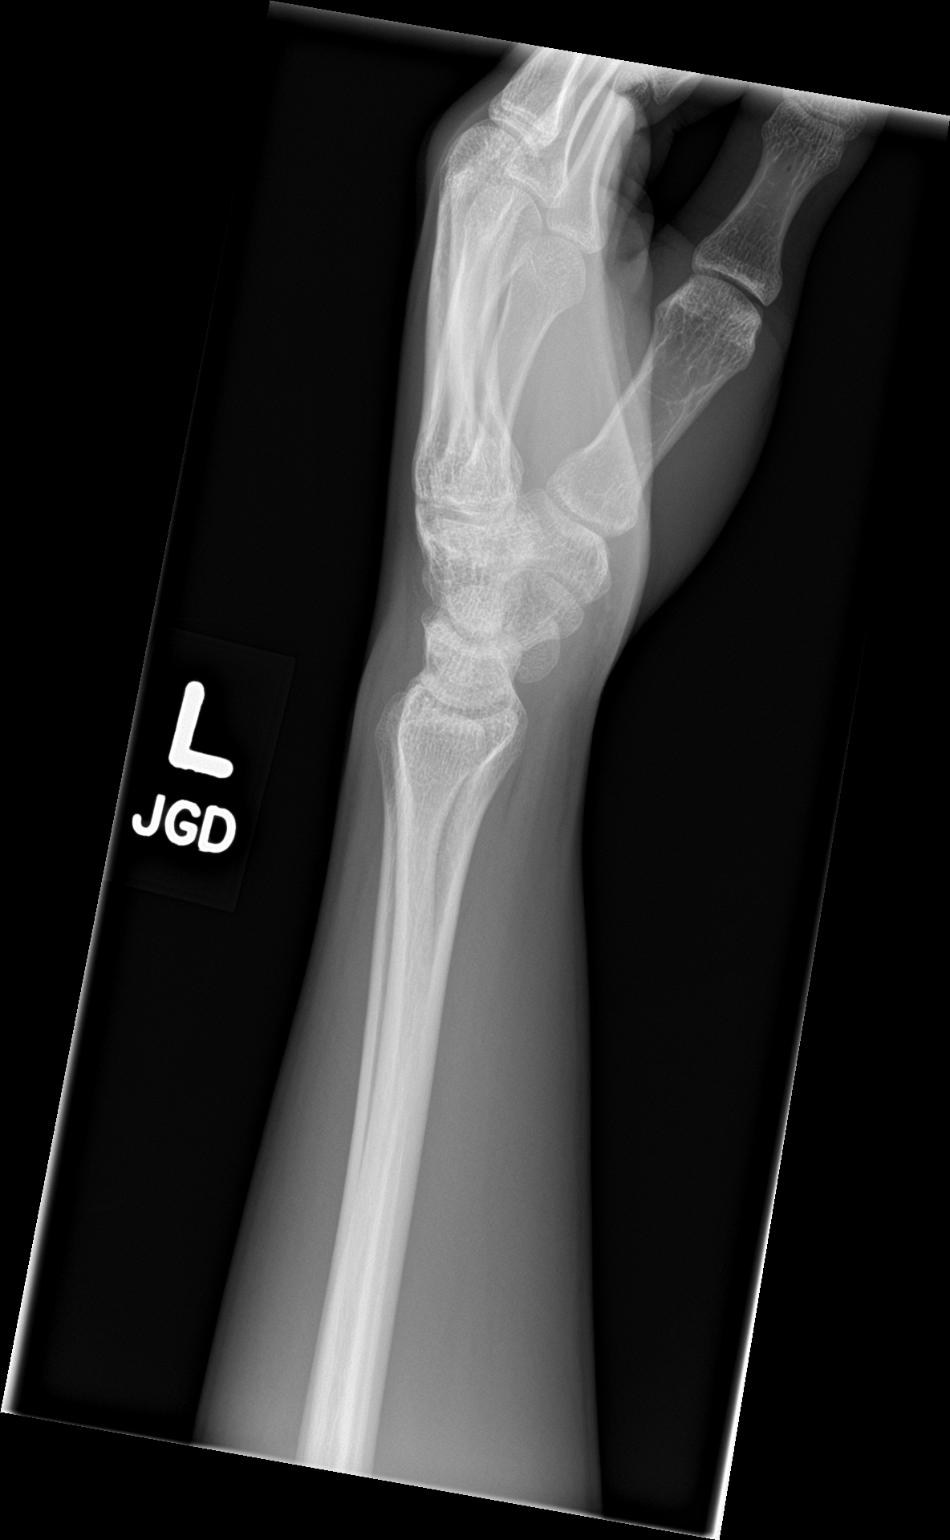

[4 of 4 positions shown; findings below may reference images not displayed]

FINDINGS: Left wrist and thumb: There is no evidence of fracture or
dislocation. There is no evidence of arthropathy or other focal bone
abnormality. Soft tissues are unremarkable.
IMPRESSION: No acute osseous abnormality.

## 2023-05-12 IMAGING — DX DG FINGER THUMB 2+V*L*
3 series · 3 of 3 positions shown · non-contrast
Comparison: None.

CLINICAL DATA: Pain with history of injury

EXAM:
LEFT WRIST - COMPLETE 3+ VIEW; LEFT THUMB 2+V

[finger ap]
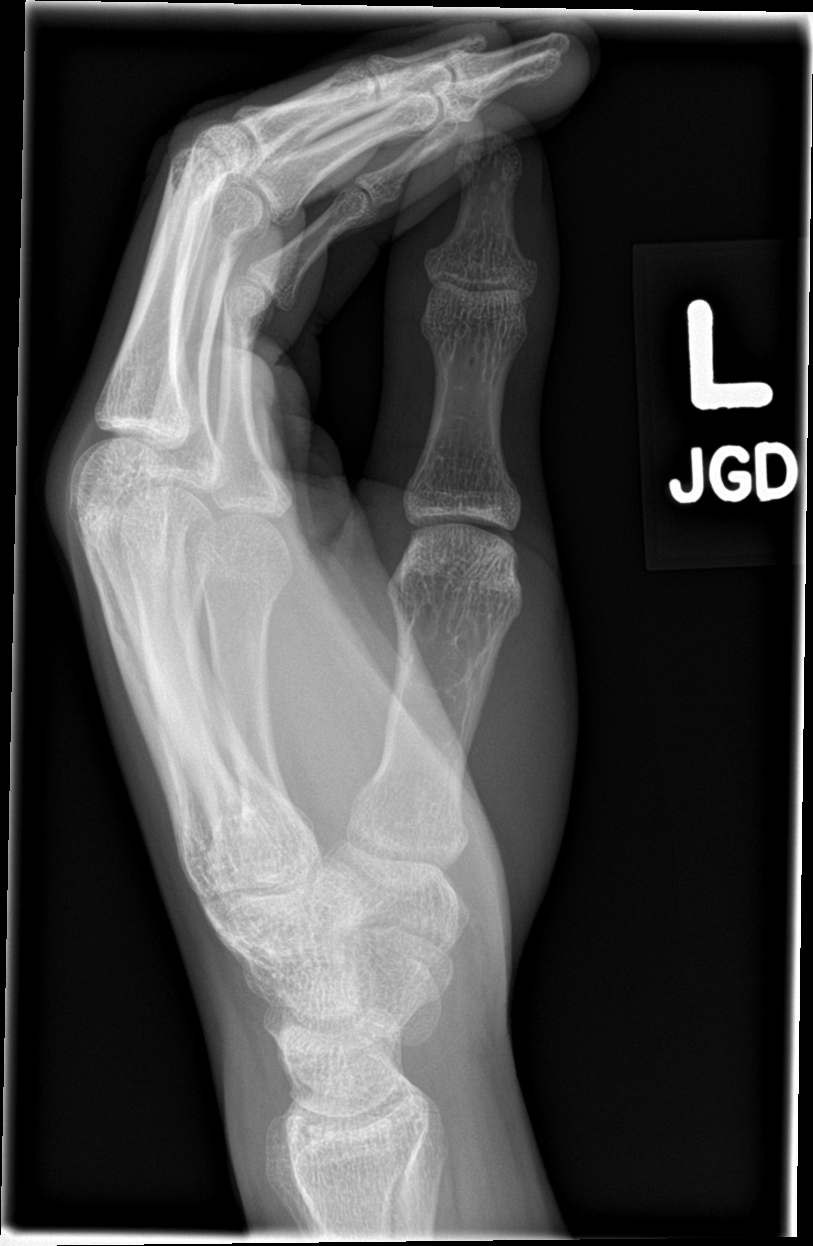

[finger obl]
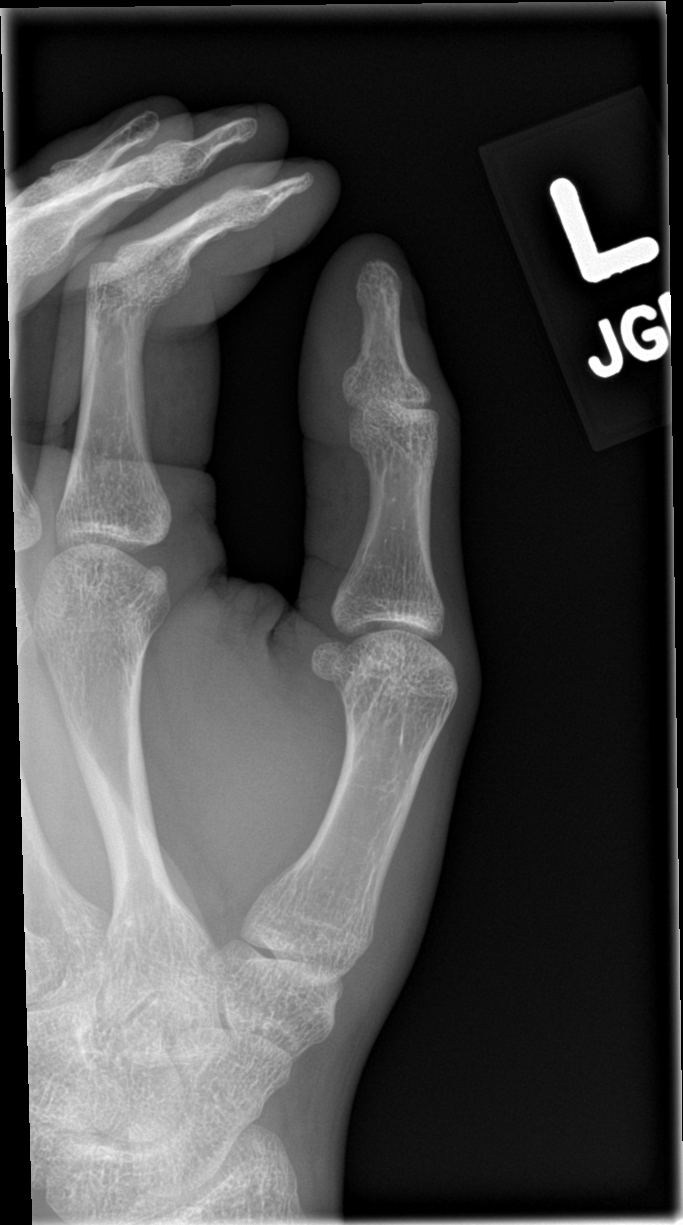

[finger lat]
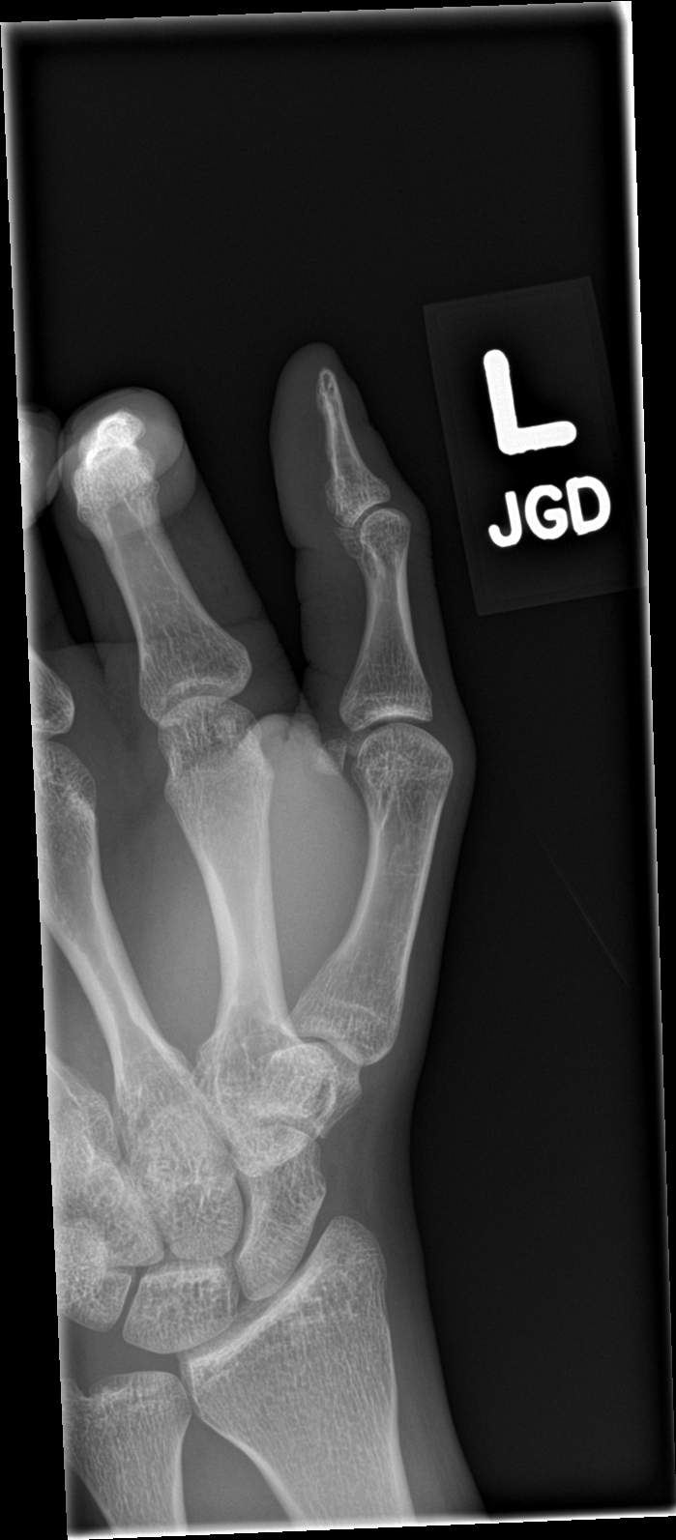

[3 of 3 positions shown; findings below may reference images not displayed]

FINDINGS: Left wrist and thumb: There is no evidence of fracture or
dislocation. There is no evidence of arthropathy or other focal bone
abnormality. Soft tissues are unremarkable.
IMPRESSION: No acute osseous abnormality.
